# Patient Record
Sex: Male | Born: 1940 | Race: White | Hispanic: No | Marital: Married | Smoking: Never smoker
Health system: Southern US, Community
[De-identification: ages and names within clinical notes are randomized; demographics above are authoritative.]

## PROBLEM LIST (undated history)

## (undated) DIAGNOSIS — G20A1 Parkinson's disease without dyskinesia, without mention of fluctuations: Secondary | ICD-10-CM

## (undated) DIAGNOSIS — N400 Enlarged prostate without lower urinary tract symptoms: Secondary | ICD-10-CM

## (undated) DIAGNOSIS — G20 Parkinson's disease: Secondary | ICD-10-CM

## (undated) DIAGNOSIS — K219 Gastro-esophageal reflux disease without esophagitis: Secondary | ICD-10-CM

## (undated) DIAGNOSIS — E785 Hyperlipidemia, unspecified: Secondary | ICD-10-CM

## (undated) DIAGNOSIS — H409 Unspecified glaucoma: Secondary | ICD-10-CM

## (undated) DIAGNOSIS — H269 Unspecified cataract: Secondary | ICD-10-CM

## (undated) HISTORY — PX: HERNIA REPAIR: SHX51

## (undated) HISTORY — PX: NECK SURGERY: SHX720

## (undated) HISTORY — PX: EYE SURGERY: SHX253

## (undated) HISTORY — DX: Unspecified cataract: H26.9

---

## 2000-12-15 ENCOUNTER — Ambulatory Visit (HOSPITAL_COMMUNITY): Admission: RE | Admit: 2000-12-15 | Discharge: 2000-12-15 | Payer: Self-pay | Admitting: Gastroenterology

## 2000-12-15 ENCOUNTER — Encounter (INDEPENDENT_AMBULATORY_CARE_PROVIDER_SITE_OTHER): Payer: Self-pay | Admitting: Specialist

## 2006-09-21 ENCOUNTER — Ambulatory Visit (HOSPITAL_COMMUNITY): Admission: RE | Admit: 2006-09-21 | Discharge: 2006-09-21 | Payer: Self-pay | Admitting: Gastroenterology

## 2008-07-25 ENCOUNTER — Ambulatory Visit: Payer: Self-pay | Admitting: Internal Medicine

## 2008-07-25 DIAGNOSIS — R0602 Shortness of breath: Secondary | ICD-10-CM

## 2008-07-25 LAB — CONVERTED CEMR LAB
Basophils Relative: 0.5 % (ref 0.0–3.0)
Eosinophils Absolute: 0.1 10*3/uL (ref 0.0–0.7)
Lymphocytes Relative: 38.4 % (ref 12.0–46.0)
MCV: 90.2 fL (ref 78.0–100.0)
Monocytes Relative: 9.4 % (ref 3.0–12.0)
Neutro Abs: 3.2 10*3/uL (ref 1.4–7.7)
Neutrophils Relative %: 49.8 % (ref 43.0–77.0)
Platelets: 219 10*3/uL (ref 150–400)
RBC: 5.15 M/uL (ref 4.22–5.81)
RDW: 12.5 % (ref 11.5–14.6)

## 2008-08-02 DIAGNOSIS — J309 Allergic rhinitis, unspecified: Secondary | ICD-10-CM | POA: Insufficient documentation

## 2008-08-29 ENCOUNTER — Ambulatory Visit: Payer: Self-pay | Admitting: Internal Medicine

## 2008-08-29 DIAGNOSIS — J984 Other disorders of lung: Secondary | ICD-10-CM

## 2008-12-27 ENCOUNTER — Ambulatory Visit: Payer: Self-pay | Admitting: Internal Medicine

## 2008-12-27 DIAGNOSIS — Z77090 Contact with and (suspected) exposure to asbestos: Secondary | ICD-10-CM

## 2009-12-26 ENCOUNTER — Ambulatory Visit: Payer: Self-pay | Admitting: Internal Medicine

## 2010-01-23 ENCOUNTER — Telehealth (INDEPENDENT_AMBULATORY_CARE_PROVIDER_SITE_OTHER): Payer: Self-pay | Admitting: *Deleted

## 2010-11-26 NOTE — Assessment & Plan Note (Signed)
Summary: 12 months/apc   Visit Type:  12 month visit Primary Provider/Referring Provider:  [facility]  CC:  no complaints.  History of Present Illness: 08/29/08- Dyspnea. No change from experience in mountains of [place]. Occ pains , mild, mostly right parasternal, nonexertional. Dr. [place] checked cardiology. PFT reviewed- high normal scores - 98%, 97%, 96%, 429 yards.  CXR- 07/25/08- NAD, incidental 4 mm nodule.CBC- NL, BNP- 64- NL.  12/27/08- Dyspnea, Lung nodule/scar Went back to [place] during winter. Had a febrile/ chill illness. Notes occasional choke on thin liquids- relates it to hx of reflux/ fundoplication. Denies waking at night with choke at this time. PPD was negative. Discussed remote significant exposure hx -asbestos and histo.  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 Chest feels fine. He is aware of some pharyngeal irritation that is easily ignored, like liquid going where it doesn't belong. Chokes easily swallowing. No choking in sleep. Denies any reflux since remote hiatal hernia surgery. Easy DOE with effort, probably unchanged and without pain, cough or sneeze. We reviewed PFT WNL, from 08/2008. CXR had shown RUL nodule- for f/u.   Current Medications (verified): 1)  Travatan Z 0.004 % Soln (Travoprost) .Marland Kitchen.. 1 Drop Daily in Each Eye 2)  Dorzolamide Hcl-Timolol Mal 22.3-6.8 Mg/ml Soln (Dorzolamide Hcl-Timolol Mal) .Marland Kitchen.. 1 Drop in Each Eye 2 Times A Day  Allergies (verified): No Known Drug Allergies  Past History:  Past Medical History: Last updated: 08/29/2008 Allergic Rhinitis HX GERD Dyspnea          -CXR 4mm nodule 07/25/08          - NL CBC, D-dimer, BNP           - PFT NL, NL  Past Surgical History: Last updated: 07/25/2008 Hiatal hernia/ fundoplication 1993 Laminectomy  Tonsilectomy Bleeding ulcer 1992 2ry to NSAIDs  Family History: Last updated:  07/25/2008 Father- Parkinson's, pneumonia Mother- peritonitis  Social History: Last updated: 12/27/2008 Patient never smoked. 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  The patient denies anorexia, fever, weight loss, weight gain, vision loss, hoarseness, chest pain, syncope, peripheral edema, prolonged cough, headaches, hemoptysis, and severe indigestion/heartburn.    Vital Signs:  Patient profile:   70 year old male Height:      71.5 inches Weight:      211.38 pounds BMI:     29.18 O2 Sat:      100 % on Room air Pulse rate:   62 / minute BP sitting:   100 / 68  (left arm) Cuff size:   regular  Vitals Entered By: Clarise Cruz Duncan Dull) (December 26, 2009 9:13 AM)  O2 Flow:  Room air  Physical Exam  Additional Exam:  General: A/Ox3; pleasant and cooperative, NAD, gentle, quiet man,  SKIN: no rash, lesions NODES: no lymphadenopathy HEENT: [place]/AT, EOM- WNL, Conjuctivae- clear, PERRLA, TM-WNL, Nose- clear, Throat- clear and wnl NECK: Supple w/ fair ROM, JVD- none, normal carotid impulses w/o bruits Thyroid- CHEST: Clear to P&A- very clear HEART: RRR, no m/g/r heard, question if second sound is loud at aortic space ABDOMEN:  ZOX:WRUE, nl pulses, no edema  NEURO:  Grossly intact to observation      Impression & Recommendations:  Problem # 1:  LUNG NODULE (ICD-518.89)  For repeat CXR of lung nodule.  Problem # 2:  DYSPNEA (ICD-786.05)  I think this is just an endurance/ stamina issue for a nonathletic individual.  Medications Added to Medication List This Visit: 1)  Travatan Z 0.004 % Soln (Travoprost) .Marland Kitchen.. 1 drop daily in each eye 2)  Dorzolamide Hcl-timolol Mal 22.3-6.8 Mg/ml Soln (Dorzolamide hcl-timolol mal) .Marland Kitchen.. 1 drop in each eye 2 times a  day  Other Orders: Est. Patient Level III (14782) T-2 View CXR (71020TC)  Patient Instructions: 1)  Schedule return in one year, earlier if needed 2)  A chest x-ray has been recommended.  Your imaging study may require preauthorization.

## 2010-11-26 NOTE — Progress Notes (Signed)
Summary: Records request from [facility]  Request for records received from [facility]. Request forwarded to [employer]. Dena Chavis  January 23, 2010 10:06 AM  Appended Document: Records request from [facility] Request for records from [facility]. Request forwarded to [employer].

## 2010-12-25 ENCOUNTER — Ambulatory Visit: Payer: Self-pay | Admitting: Internal Medicine

## 2010-12-30 ENCOUNTER — Ambulatory Visit (INDEPENDENT_AMBULATORY_CARE_PROVIDER_SITE_OTHER): Payer: Medicare Other | Admitting: Internal Medicine

## 2010-12-30 ENCOUNTER — Encounter: Payer: Self-pay | Admitting: Internal Medicine

## 2010-12-30 DIAGNOSIS — R0602 Shortness of breath: Secondary | ICD-10-CM

## 2010-12-30 DIAGNOSIS — Z77090 Contact with and (suspected) exposure to asbestos: Secondary | ICD-10-CM

## 2010-12-30 DIAGNOSIS — J984 Other disorders of lung: Secondary | ICD-10-CM

## 2011-01-05 NOTE — Assessment & Plan Note (Signed)
Summary: follow up visit/kcw   Primary Provider/Referring Provider:  [facility]  CC:  Follow up visit-"flap" in throat-feels like a tickle sensation in it.Phillip Acosta  History of Present Illness:  12/27/08- Dyspnea, Lung nodule/scar Went back to [place] during winter. Had a febrile/ chill illness. Notes occasional choke on thin liquids- relates it to hx of reflux/ fundoplication. Denies waking at night with choke at this time. PPD was negative. Discussed remote significant exposure hx -asbestos and histo.  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 Chest feels fine. He is aware of some pharyngeal irritation that is easily ignored, like liquid going where it doesn't belong. Chokes easily swallowing. No choking in sleep. Denies any reflux since remote hiatal hernia surgery. Easy DOE with effort, probably unchanged and without pain, cough or sneeze. We reviewed PFT WNL, from 08/2008. CXR had shown RUL nodule- for f/u.  December 30, 2010-  Dyspnea, Lung nodule/ scar CXR March, 2011 was NAD/ bronchitis- reviewed w/ him.  He was in [place] again 3 times since last here, and once to [place], most recently in [place] Feb 2-6, 2012. Admits some cough, productive white which is incidental. Wakes in the morning with some head and chest congestion after he starts moving. He doesn't feel need to take anything for it. We discussed Mucinex and hydration. Throat tickle. May choke a little swallowing at times.       Preventive Screening-Counseling & Management  Alcohol-Tobacco     Smoking Status: never  Current Medications (verified): 1)  Travatan Z 0.004 % Soln (Travoprost) .Phillip Acosta.. 1 Drop Daily in Each Eye 2)  Dorzolamide Hcl-Timolol Mal 22.3-6.8 Mg/ml Soln (Dorzolamide Hcl-Timolol Mal) .Phillip Acosta.. 1 Drop in Each Eye 2 Times A Day 3)  Niacin 500 Mg Tabs (Niacin) .... Take 1 By Mouth Once Daily  Allergies (verified): No Known Drug  Allergies  Past History:  Past Medical History: Last updated: 08/29/2008 Allergic Rhinitis HX GERD Dyspnea          -CXR 4mm nodule 07/25/08          - NL CBC, D-dimer, BNP           - PFT NL, NL  Past Surgical History: Last updated: 07/25/2008 Hiatal hernia/ fundoplication 1993 Laminectomy  Tonsilectomy Bleeding ulcer 1992 2ry to NSAIDs  Family History: Last updated: 07/25/2008 Father- Parkinson's, pneumonia Mother- peritonitis  Social History: Last updated: 12/27/2008 Patient never smoked. 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  The patient denies shortness of breath at rest, non-productive cough, coughing up blood, chest pain, irregular heartbeats, acid heartburn, indigestion, loss of appetite, weight change, abdominal pain, difficulty swallowing, sore throat, tooth/dental problems, headaches, ear ache, hand/feet swelling, rash, change in color of mucus, and fever.    Vital Signs:  Patient profile:   70 year old male Height:      71.5 inches Weight:      215.25 pounds BMI:     29.71 O2 Sat:      100 % on Room air Pulse rate:   52 / minute BP sitting:  102 / 54  (left arm) Cuff size:   regular  Vitals Entered By: Reynaldo Minium CMA (December 30, 2010 11:45 AM)  O2 Flow:  Room air CC: Follow up visit-"flap" in throat-feels like a tickle sensation in it.   Physical Exam  Additional Exam:  General: A/Ox3; pleasant and cooperative, NAD, gentle, quiet man,  SKIN: no rash, lesions NODES: no lymphadenopathy HEENT: [employer]/AT, EOM- WNL, Conjuctivae- clear, PERRLA, TM-WNL, Nose- clear, Throat- clear and wnl NECK: Supple w/ fair ROM, JVD- none, normal  carotid impulses w/o bruits Thyroid- CHEST: Clear to P&A- very clear HEART: RRR, no m/g/r heard, question if second sound is loud at aortic space ABDOMEN:  NFA:OZHY, nl pulses, no edema  NEURO: Grossly intact to observation      EKG  Procedure date:  12/26/2009  Findings:       CHEST - 2 VIEW Comparison: 07/25/2008 Findings: Normal cardiomediastinal silhouette.  Lungs are clear. No nodules are appreciated.  No pleural thickening/effusions.  Minimal scarring at the left lung base.  Minimal central peribronchial thickening, a chronic finding.   Intact bony thorax.  Spondylosis of the lower dorsal spine. IMPRESSION: No active chest disease.  See comments above. Read By:  Jonne Ply,  M.D.  Impression & Recommendations:  Problem # 1:  LUNG NODULE (ICD-518.89)  Lung nodule was not seen in 2011, 2 years aftrer initial film, so it has not grown. We discussed this and he feels comfortable returning on an as needed basis.   Problem # 2:  ASBESTOS EXPOSURE, HX OF (ICD-V15.84)  Significant changes, plaques etc are not seen.   Problem # 3:  DYSPNEA (ICD-786.05)  Minor bronchitic and scarring changes, quiet chest and little in way of symptoms. He understands to report changes.   Medications Added to Medication List This Visit: 1)  Niacin 500 Mg Tabs (Niacin) .... Take 1 by mouth once daily  Other Orders: Est. Patient Level III (86578)  Patient Instructions: 1)  Please schedule a follow-up appointment as needed. I will be happy to see you again as needed, especially for changes in the way your chest feels, cough or etc.    EKG  Procedure date:  12/26/2009  Findings:       CHEST - 2 VIEW Comparison: 07/25/2008 Findings: Normal cardiomediastinal silhouette.  Lungs are clear. No nodules are appreciated.  No pleural thickening/effusions.  Minimal scarring at the left lung base.  Minimal central peribronchial thickening, a chronic finding.   Intact bony  thorax.  Spondylosis of the lower dorsal spine. IMPRESSION: No active chest disease.  See comments above. Read By:  Jonne Ply,  Judie Petit.D.

## 2011-03-12 NOTE — Procedures (Signed)
[facility]  Patient:    Phillip Acosta, Phillip Acosta                         MRN: [id number] Proc. Date: 12/15/00 Adm. Date:  [id number] Attending:  Nelda Marseille CC:         Laban Emperor. Cloward, M.D., [facility]   Procedure Report  PROCEDURE:  Colonscopy with polypectomy.  ENDOSCOPIST:  Petra Kuba, M.D.  INDICATIONS:  Guaiac positivity, due for colonic screening.  Consent was signed after risks, benefits, methods, and options were thoroughly discussed in the office.  MEDICINES USED:  Demerol 50, Versed 5.  DESCRIPTION OF PROCEDURE:  Rectal inspection was pertinent for external hemorrhoids, small.  Digital exam was negative.  Video colonoscope was inserted and easily advanced around the colon to the cecum.  This did require some abdominal pressure but no position changes.  The cecum was identified by the appendiceal orifice and the ileocecal valve.  No obvious abnormalities, but left-sided diverticulum were seen on insertion.  The scope was slowly withdrawn.  Prep was adequate.  There was some liquid stool that required washing and suctioning.  Cecum, ascending, and transverse were normal.  As the scope was withdrawn around the descending, a small 2 to 3 mm polyp was seen, was hot biopsied x 1.  There were some left-sided diverticulum and wall edema, but no other abnormalities were seen as we withdrew back to the distal sigmoid where another small, 1 to 2 mm polyp was seen and was hot biopsied x 1.  The scope was withdrawn back to the rectum, and a small polyp was seen on the fold which was initially snared, but the snare cut through the polyp, and we hot biopsied the base.  The polyp that was removed was suctioned through the scope, collected in the trap, and recovered.  All polyps were put in the same container.  The scope was retroflexed back in the rectum pertinent for some internal hemorrhoids.  The scope was then straightened and readvanced a  short ways up the sigmoid.  Air was suctioned and scope removed.  The patient tolerated the procedure well.  There was no obvious immediate complication.  ENDOSCOPIC DIAGNOSIS: 1. Internal and external hemorrhoids. 2. Left-sided diverticulum and wall edema. 3. Three small polyps, two hot biopsied in the distal sigmoid and descending,    one in the rectum snared and hot biopsied. 4. Otherwise within normal limits to the cecum.  PLAN:  Await pathology to determine future colonic screening.  Otherwise see back p.r.n. or in two mounts to recheck guaiacs and make sure no further workup plans are needed.  In the meantime, one-week post polypectomy instructions. DD:  12/15/00 TD:  12/16/00 Job: [id number]

## 2011-03-12 NOTE — Op Note (Signed)
NAME:  Phillip Acosta, ARBOLEDA NO.:  [id number]   MEDICAL RECORD NO.:  [id number]          PATIENT TYPE:  AMB   LOCATION:  ENDO                         FACILITY:  [facility]   PHYSICIAN:  Anselmo Rod, M.D.  DATE OF BIRTH:  17-Feb-1941   DATE OF PROCEDURE:  09/21/2006  DATE OF DISCHARGE:                               OPERATIVE REPORT   PROCEDURE PERFORMED:  Screening colonoscopy.   ENDOSCOPIST:  Anselmo Rod, M.D.   INSTRUMENT USED:  Olympus video colonoscope.   INDICATIONS FOR PROCEDURE:  A 70 year old white male with a personal  history of colonic polyps undergoing surveillance colonoscopy to rule  out recurrent polyps, masses, etc.  The patient has occasional rectal  bleeding which he attributes to his hemorrhoids.   PREPROCEDURE PREPARATION:  Informed consent was procured from the  patient.  The patient was fasted for eight hours prior to the procedure  and prepped with a gallon of TriLyte the night prior to the procedure.  The risks and benefits of the procedure including a 10% miss rate for  cancer or polyps was discussed with the patient as well.   PREPROCEDURE PHYSICAL:  The patient had stable vital signs.  Neck  supple.  Chest clear to auscultation.  S1 and S2 regular.  Abdomen soft  with normal bowel sounds.   DESCRIPTION OF PROCEDURE:  The patient was placed in left lateral  decubitus position and sedated with 75 mcg of fentanyl and 7.5 mg of  Versed given intravenously in slow incremental doses.  Once the patient  was adequately sedated and maintained on low flow oxygen and continuous  cardiac monitoring, the Olympus video colonoscope was advanced from the  rectum to the cecum.  The appendicular orifice and ileocecal valve were  clearly visualized and photographed.  There was a malfunctioning with  the process and therefore the photographs in the right colon did not  take.  The terminal ileum appeared healthy and without lesions.  There  was a  significant amount of residual stool in the dependent areas of the  colon.  Multiple washes were done.  Small lesions could be missed.  Small internal hemorrhoids were seen on retroflexion.  A few early  sigmoid diverticula were appreciated.  The patient tolerated the  procedure well without immediate complication.   IMPRESSION:  1. Small nonbleeding internal hemorrhoids.  2. Early sigmoid diverticulosis.  3. No masses or polyps seen.  4. Significant amount of residual stool in the colon, multiple washes      were done.  Small lesions could be missed.   RECOMMENDATIONS:  1. Continue high fiber diet with liberal fluid intake.  2. Repeat colonoscopy in the next five years unless the patient      develops any abnormal symptoms in the interim, in which case he      should contact the office immediately for further recommendations.  3. Outpatient followup as need arises in the future.      Anselmo Rod, M.D.  Electronically Signed     JNM/MEDQ  D:  09/21/2006  T:  09/21/2006  Job:  [id number]   cc:   Gabriel Earing, M.D.

## 2012-07-07 DIAGNOSIS — Z818 Family history of other mental and behavioral disorders: Secondary | ICD-10-CM | POA: Insufficient documentation

## 2012-07-21 ENCOUNTER — Other Ambulatory Visit: Payer: Self-pay | Admitting: Gastroenterology

## 2012-07-21 DIAGNOSIS — R1310 Dysphagia, unspecified: Secondary | ICD-10-CM

## 2012-07-27 ENCOUNTER — Ambulatory Visit
Admission: RE | Admit: 2012-07-27 | Discharge: 2012-07-27 | Disposition: A | Discharge status: Home / Self Care | Payer: Medicare Other | Source: Ambulatory Visit | Attending: Gastroenterology | Admitting: Gastroenterology

## 2012-07-27 DIAGNOSIS — R1310 Dysphagia, unspecified: Secondary | ICD-10-CM

## 2012-12-22 ENCOUNTER — Emergency Department (HOSPITAL_COMMUNITY)
Admission: EM | Admit: 2012-12-22 | Discharge: 2012-12-22 | Disposition: A | Discharge status: Home / Self Care | Payer: Medicare Other | Attending: Emergency Medicine | Admitting: Emergency Medicine

## 2012-12-22 ENCOUNTER — Emergency Department (HOSPITAL_COMMUNITY): Payer: Medicare Other

## 2012-12-22 ENCOUNTER — Encounter (HOSPITAL_COMMUNITY): Payer: Self-pay | Admitting: Emergency Medicine

## 2012-12-22 DIAGNOSIS — S0990XA Unspecified injury of head, initial encounter: Secondary | ICD-10-CM | POA: Insufficient documentation

## 2012-12-22 DIAGNOSIS — Y92410 Unspecified street and highway as the place of occurrence of the external cause: Secondary | ICD-10-CM | POA: Insufficient documentation

## 2012-12-22 DIAGNOSIS — S0993XA Unspecified injury of face, initial encounter: Secondary | ICD-10-CM | POA: Insufficient documentation

## 2012-12-22 DIAGNOSIS — Z7982 Long term (current) use of aspirin: Secondary | ICD-10-CM | POA: Insufficient documentation

## 2012-12-22 DIAGNOSIS — Y9389 Activity, other specified: Secondary | ICD-10-CM | POA: Insufficient documentation

## 2012-12-22 DIAGNOSIS — Z79899 Other long term (current) drug therapy: Secondary | ICD-10-CM | POA: Insufficient documentation

## 2012-12-22 MED ORDER — HYDROCODONE-ACETAMINOPHEN 5-325 MG PO TABS: 1.0000 | ORAL_TABLET | Freq: Three times a day (TID) | ORAL | Status: DC | PRN
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
## 2012-12-22 NOTE — ED Notes (Signed)
Dr.Lockwood at bedside  
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
## 2012-12-22 NOTE — ED Notes (Signed)
Pt returned from xray

## 2012-12-22 NOTE — ED Notes (Signed)
Discharge instructions reviewed. Pt verbalized understanding.  
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
## 2014-02-12 ENCOUNTER — Other Ambulatory Visit (HOSPITAL_COMMUNITY): Payer: Self-pay | Admitting: Otolaryngology

## 2014-02-12 DIAGNOSIS — R1310 Dysphagia, unspecified: Secondary | ICD-10-CM

## 2014-02-14 ENCOUNTER — Ambulatory Visit (HOSPITAL_COMMUNITY): Payer: No Typology Code available for payment source

## 2014-02-20 ENCOUNTER — Ambulatory Visit (HOSPITAL_COMMUNITY)
Admission: RE | Admit: 2014-02-20 | Discharge: 2014-02-20 | Disposition: A | Discharge status: Home / Self Care | Payer: Medicare HMO | Source: Ambulatory Visit | Attending: Otolaryngology | Admitting: Otolaryngology

## 2014-02-20 DIAGNOSIS — R1311 Dysphagia, oral phase: Secondary | ICD-10-CM | POA: Insufficient documentation

## 2014-02-20 DIAGNOSIS — R1313 Dysphagia, pharyngeal phase: Secondary | ICD-10-CM | POA: Insufficient documentation

## 2014-02-20 DIAGNOSIS — R1310 Dysphagia, unspecified: Secondary | ICD-10-CM | POA: Insufficient documentation

## 2014-02-20 NOTE — Procedures (Signed)
Objective Swallowing Evaluation: Modified Barium Swallowing Study  Patient Details  Name: Phillip Acosta MRN: [id number] Date of Birth: 08/18/1941  Today's Date: 02/20/2014 Time: [id number] SLP Time Calculation (min): 30 min  Past Medical History: No past medical history on file. Past Surgical History:  Past Surgical History  Procedure Laterality Date  . Neck surgery     HPI:  73 year old seen for outpatient MBS with complaints of pharyngeal and esophageal globus sensation, coughing with liquids intermittently and vocal cords "seem coated."  PMH:  hiatial hernia with Nissen fundiplication, neck surgery many years ago without hardware.  Pt. denies recent respiratory illness or pna.  He denies takes omeprazole daily.    Assessment / Plan / Recommendation Clinical Impression  Dysphagia Diagnosis: Mild oral phase dysphagia;Mild pharyngeal phase dysphagia Clinical impression: Pt. exhibited minimal oral dysphagia resulting in lingual residue and piecemeal transit pattern.  Min-mild pharyngeal dysphagia with reduced tongue base retraction laryngeal elevation leading to consistent mild-moderate vallecular and pyriform sinus residue.  Pt. inconsistently sensed residue and swallows twice to decrease/clear residue.  Flash laryngeal penetration observed once when barium briefly entered the laryngeal vestibule and spontaneously exited during the swallow.  A chin tuck posture did not produce any significant benefit or improvement in swallow function.  Pt. demonstrated a hoarse, sometimes wet vocal quality immediately following liquid and puree without observance of penetration.  SLP recommends continue regular texture diet (using caution with dry/crumbly foods and meats) and thin liquids, straws okay with small sips and pills whole in applesauce/yogurt/pudding if desired.            Treatment Recommendation  No treatment recommended at this time    Diet Recommendation Regular;Thin liquid   Liquid  Administration via: Cup;Straw Medication Administration: Whole meds with liquid (attempt whole applesauce if difficulty w/ liq) Supervision: Patient able to self feed Compensations: Slow rate;Small sips/bites;Multiple dry swallows after each bite/sip;Clear throat intermittently Postural Changes and/or Swallow Maneuvers: Seated upright 90 degrees;Upright 30-60 min after meal    Other  Recommendations Oral Care Recommendations: Oral care BID   Follow Up Recommendations  None    Frequency and Duration        Pertinent Vitals/Pain WDL          Reason for Referral Objectively evaluate swallowing function   Oral Phase Oral Preparation/Oral Phase Oral Phase: Impaired Oral - Thin Oral - Thin Cup: Piecemeal swallowing;Lingual/palatal residue Oral - Solids Oral - Puree: Lingual/palatal residue;Piecemeal swallowing Oral - Regular: Lingual/palatal residue   Pharyngeal Phase Pharyngeal Phase Pharyngeal Phase: Impaired Pharyngeal - Thin Pharyngeal - Thin Cup: Significant aspiration (Amount);Pharyngeal residue - valleculae;Reduced tongue base retraction;Reduced laryngeal elevation;Penetration/Aspiration during swallow Penetration/Aspiration details (thin cup): Material enters airway, remains ABOVE vocal cords then ejected out (x 1) Pharyngeal - Thin Straw: Significant aspiration (Amount);Pharyngeal residue - valleculae;Reduced tongue base retraction;Reduced laryngeal elevation Pharyngeal - Solids Pharyngeal - Regular: Pharyngeal residue - valleculae;Reduced tongue base retraction  Cervical Esophageal Phase    GO    Cervical Esophageal Phase [facility]erv[facility] Esophageal [facility]: [facility]    Functional Assessment Tool Used: skilled clinical judgement Functional Limitations: Swallowing Swallow Current Status (L2440(G8996): At least 20 percent but less than 40 percent impaired, limited or restricted Swallow Goal Status [phone](G8997): At least 20 percent but less than 40 percent impaired, limited or  restricted Swallow Discharge Status [phone](G8998): At least 20 percent but less than 40 percent impaired, limited or restricted    Royce MacadamiaLisa Willis Kamrin Sibley M.Ed [employer]-SLP Pager [phone]  02/20/2014

## 2014-12-04 DIAGNOSIS — N3941 Urge incontinence: Secondary | ICD-10-CM | POA: Insufficient documentation

## 2014-12-04 DIAGNOSIS — N401 Enlarged prostate with lower urinary tract symptoms: Secondary | ICD-10-CM

## 2014-12-04 DIAGNOSIS — E785 Hyperlipidemia, unspecified: Secondary | ICD-10-CM | POA: Insufficient documentation

## 2014-12-04 DIAGNOSIS — K219 Gastro-esophageal reflux disease without esophagitis: Secondary | ICD-10-CM | POA: Insufficient documentation

## 2015-06-17 ENCOUNTER — Other Ambulatory Visit (HOSPITAL_COMMUNITY): Payer: Self-pay | Admitting: Gastroenterology

## 2015-06-17 DIAGNOSIS — R1314 Dysphagia, pharyngoesophageal phase: Secondary | ICD-10-CM

## 2015-06-24 ENCOUNTER — Ambulatory Visit (HOSPITAL_COMMUNITY)
Admission: RE | Admit: 2015-06-24 | Discharge: 2015-06-24 | Disposition: A | Discharge status: Home / Self Care | Payer: Medicare HMO | Source: Ambulatory Visit | Attending: Gastroenterology | Admitting: Gastroenterology

## 2015-06-24 ENCOUNTER — Ambulatory Visit (HOSPITAL_COMMUNITY): Payer: Medicare HMO

## 2015-06-24 DIAGNOSIS — R1310 Dysphagia, unspecified: Secondary | ICD-10-CM | POA: Diagnosis not present

## 2015-06-24 DIAGNOSIS — R1314 Dysphagia, pharyngoesophageal phase: Secondary | ICD-10-CM

## 2015-06-25 DIAGNOSIS — R1310 Dysphagia, unspecified: Secondary | ICD-10-CM | POA: Diagnosis not present

## 2015-06-25 NOTE — Progress Notes (Signed)
Speech Pathology   MBSS complete. Full report located under chart review in imaging section. Click on DG swallow function.     Neshia Mckenzie Willis Narely Nobles M.Ed CCC-SLP Pager [phone]   

## 2016-01-21 IMAGING — RF DG SWALLOWING FUNCTION
1 series · 17 of 24 positions shown · non-contrast
Comparison: None.

CLINICAL DATA: Difficulty with swallowing.

EXAM:
MODIFIED BARIUM SWALLOW
TECHNIQUE: Different consistencies of barium were administered orally to the
patient by the Speech Pathologist. Imaging of the pharynx was
performed in the lateral projection.
FLUOROSCOPY TIME:  Radiation Exposure Index (as provided by the
fluoroscopic device): 275 microGy*m^2
If the device does not provide the exposure index:
Fluoroscopy Time:  dictate in minutes and seconds
Number of Acquired Images:

[Series 1: run · 24 acquisitions, 17 frames shown]
[im 1/24]
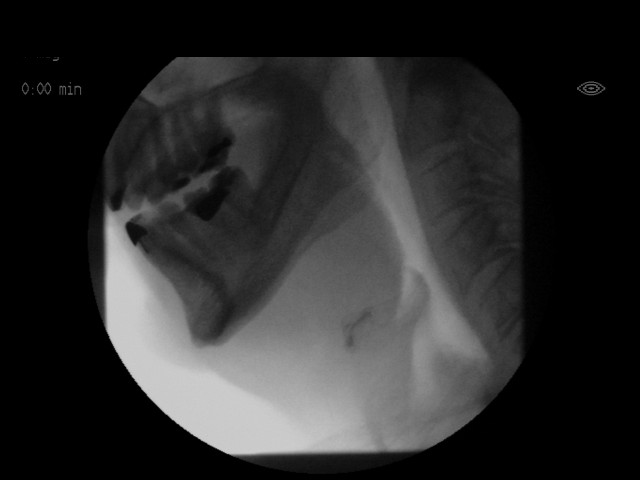
[im 3/24]
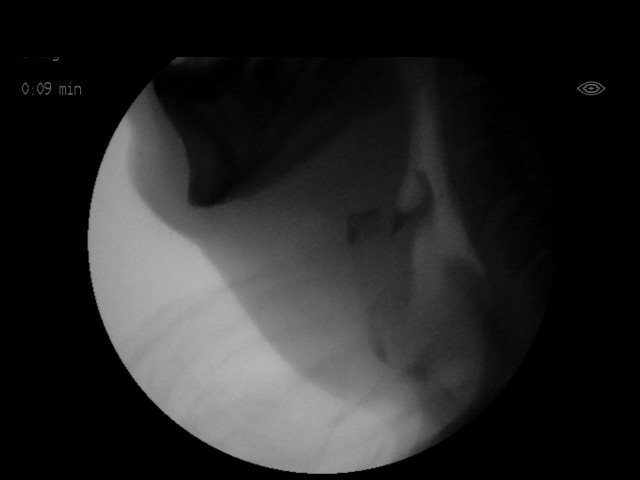
[im 4/24]
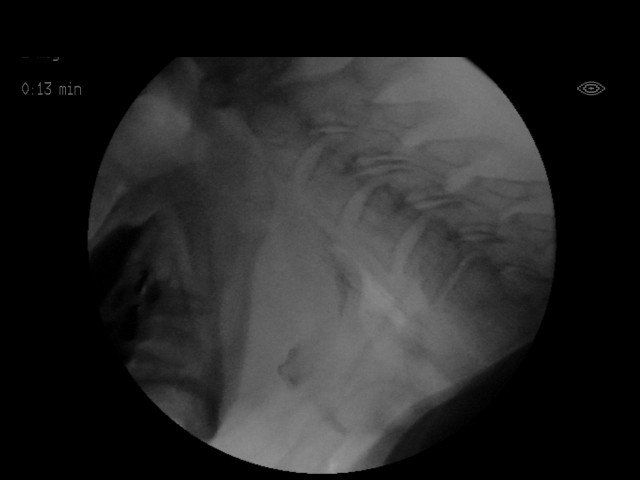
[im 5/24]
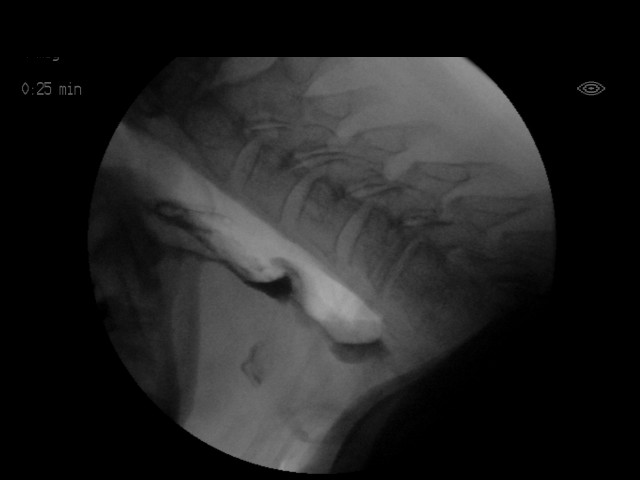
[im 7/24]
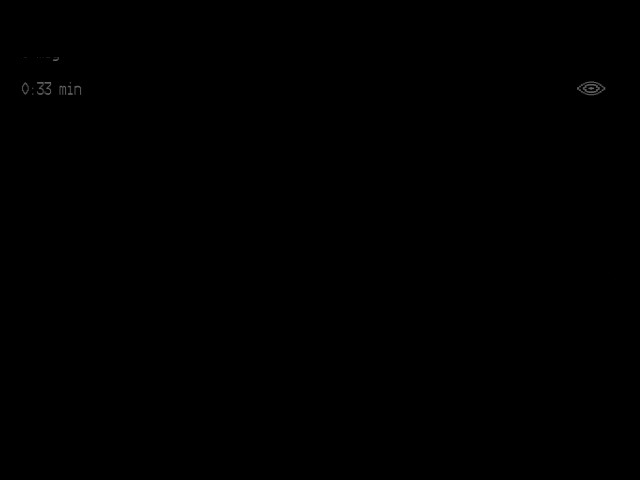
[im 8/24]
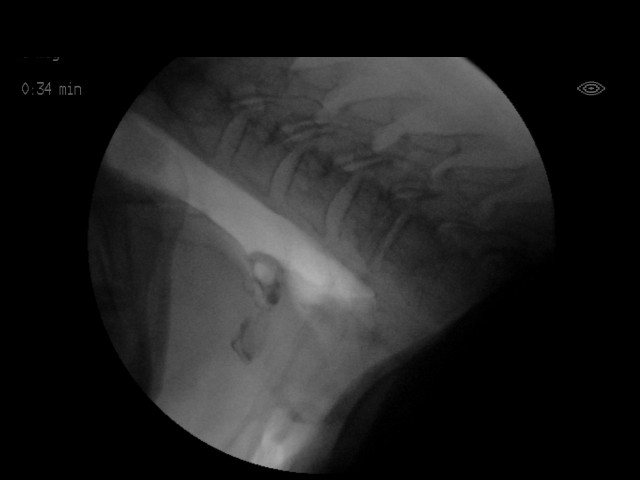
[im 10/24]
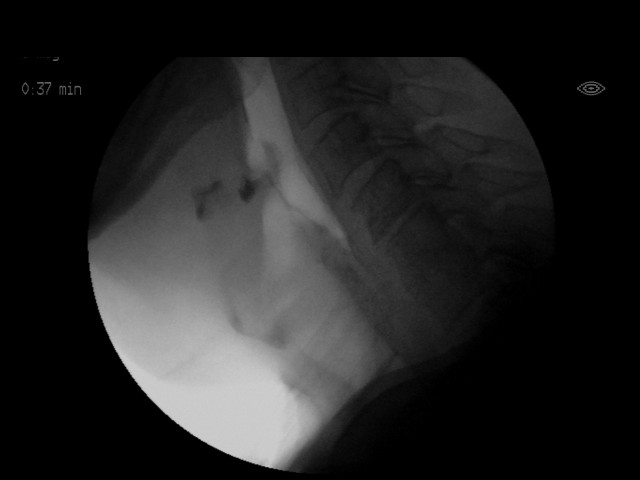
[im 11/24]
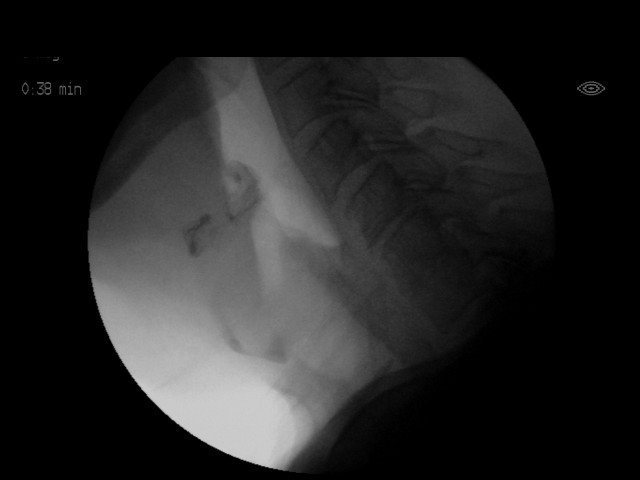
[im 13/24]
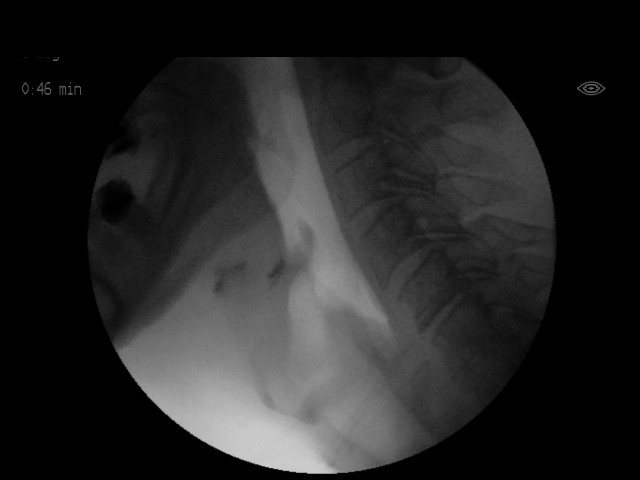
[im 14/24]
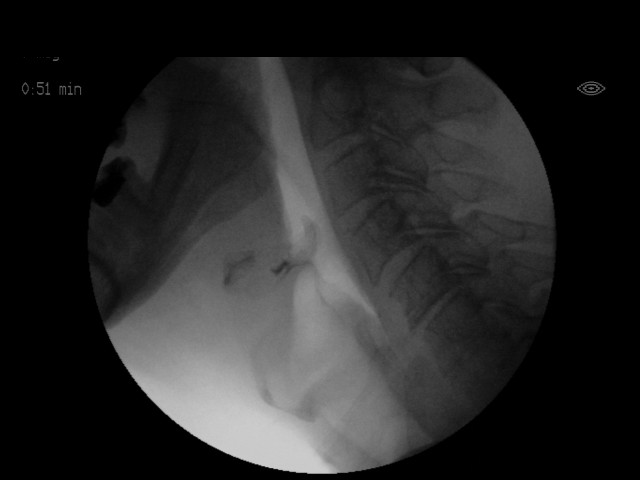
[im 15/24]
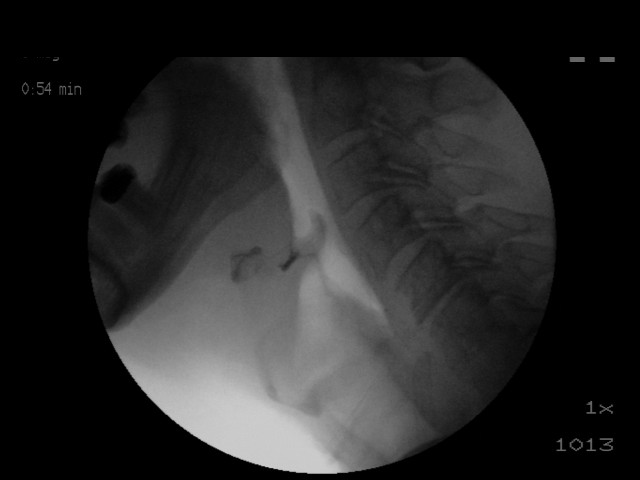
[im 17/24]
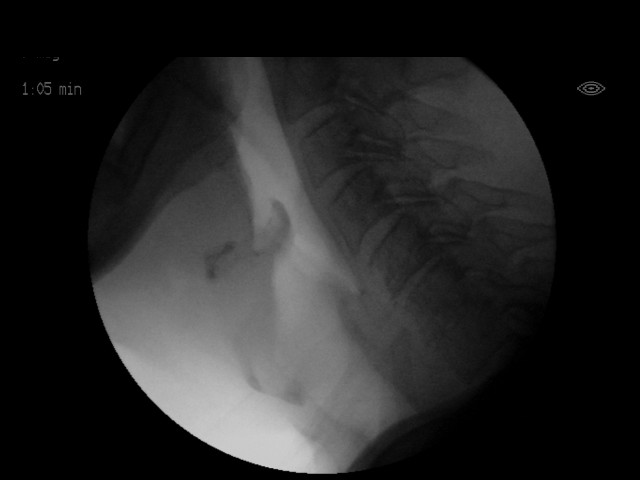
[im 18/24]
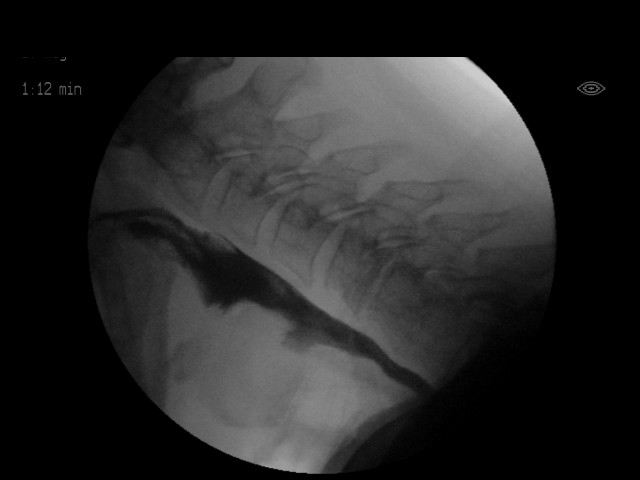
[im 20/24]
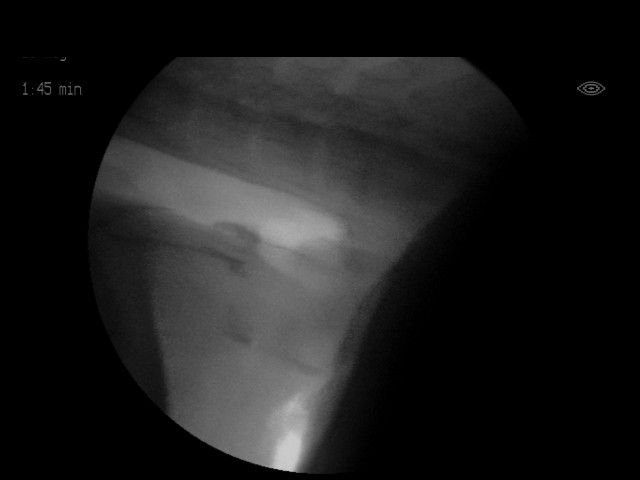
[im 21/24]
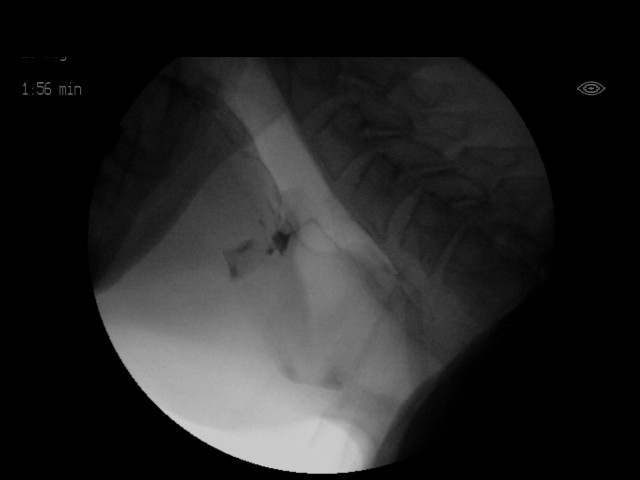
[im 22/24]
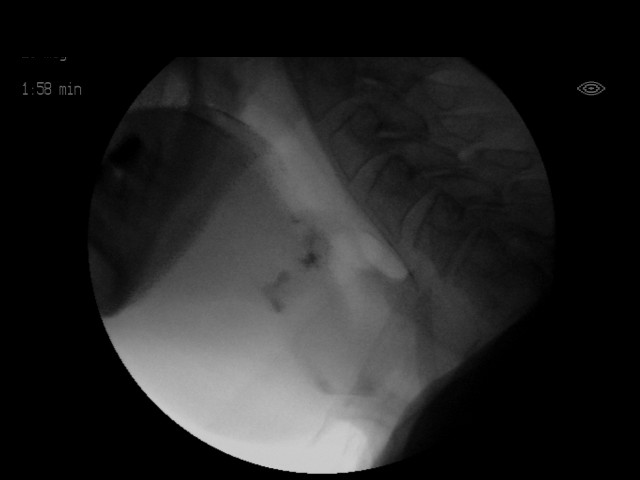
[im 24/24]
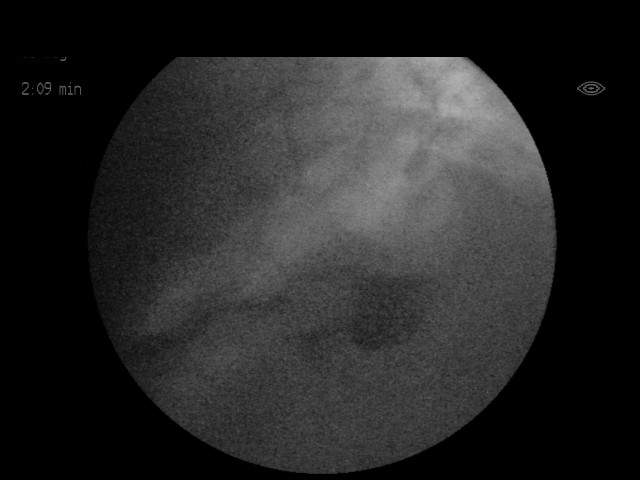

[17 of 24 positions shown; findings below may reference images not displayed]

FINDINGS: Thin liquid- swallowing of thin liquid from a cup demonstrated
penetration to the cords, without a cough.

Five subsequent swallows with chin-tuck demonstrated no laryngeal
penetration, although several head early spill over.

Hamill?Mangione with cracker- normal, with chin-tuck.

Barium tablet -  within normal limits, with chin-tuck
IMPRESSION: 1. Thin liquid penetration to the laryngeal cords, without a cough.
This resolved completely during six subsequent swallows using a
chin-tuck maneuver.

Please refer to the Speech Pathologists report for complete details
and recommendations.

## 2016-11-22 ENCOUNTER — Encounter (HOSPITAL_COMMUNITY): Payer: Self-pay | Admitting: Emergency Medicine

## 2016-11-22 ENCOUNTER — Emergency Department (HOSPITAL_COMMUNITY)
Admission: EM | Admit: 2016-11-22 | Discharge: 2016-11-22 | Disposition: A | Discharge status: Home / Self Care | Payer: Medicare HMO | Attending: Emergency Medicine | Admitting: Emergency Medicine

## 2016-11-22 ENCOUNTER — Emergency Department (HOSPITAL_COMMUNITY): Payer: Medicare HMO

## 2016-11-22 DIAGNOSIS — R509 Fever, unspecified: Secondary | ICD-10-CM | POA: Diagnosis not present

## 2016-11-22 DIAGNOSIS — R6889 Other general symptoms and signs: Secondary | ICD-10-CM

## 2016-11-22 DIAGNOSIS — G20 Parkinson's disease: Secondary | ICD-10-CM | POA: Insufficient documentation

## 2016-11-22 DIAGNOSIS — R5383 Other fatigue: Secondary | ICD-10-CM | POA: Diagnosis not present

## 2016-11-22 DIAGNOSIS — Z7982 Long term (current) use of aspirin: Secondary | ICD-10-CM | POA: Diagnosis not present

## 2016-11-22 HISTORY — DX: Hyperlipidemia, unspecified: E78.5

## 2016-11-22 HISTORY — DX: Unspecified glaucoma: H40.9

## 2016-11-22 HISTORY — DX: Benign prostatic hyperplasia without lower urinary tract symptoms: N40.0

## 2016-11-22 HISTORY — DX: Parkinson's disease: G20

## 2016-11-22 HISTORY — DX: Parkinson's disease without dyskinesia, without mention of fluctuations: G20.A1

## 2016-11-22 HISTORY — DX: Gastro-esophageal reflux disease without esophagitis: K21.9

## 2016-11-22 LAB — COMPREHENSIVE METABOLIC PANEL
ALBUMIN: 4 g/dL (ref 3.5–5.0)
ALK PHOS: 61 U/L (ref 38–126)
ALT: 10 U/L — AB (ref 17–63)
ANION GAP: 8 (ref 5–15)
AST: 35 U/L (ref 15–41)
BILIRUBIN TOTAL: 1.4 mg/dL — AB (ref 0.3–1.2)
BUN: 13 mg/dL (ref 6–20)
CALCIUM: 8.4 mg/dL — AB (ref 8.9–10.3)
CO2: 24 mmol/L (ref 22–32)
CREATININE: 1.16 mg/dL (ref 0.61–1.24)
Chloride: 99 mmol/L — ABNORMAL LOW (ref 101–111)
GFR calc Af Amer: >60 mL/min (ref >60)
GFR calc non Af Amer: >60 mL/min (ref >60)
GLUCOSE: 105 mg/dL — AB (ref 65–99)
Potassium: 4.8 mmol/L (ref 3.5–5.1)
Sodium: 131 mmol/L — ABNORMAL LOW (ref 135–145)
TOTAL PROTEIN: 6.8 g/dL (ref 6.5–8.1)

## 2016-11-22 LAB — CBC WITH DIFFERENTIAL/PLATELET
BASOS ABS: 0 10*3/uL (ref 0.0–0.1)
BASOS PCT: 0 %
EOS ABS: 0 10*3/uL (ref 0.0–0.7)
Eosinophils Relative: 0 %
HEMATOCRIT: 42.9 % (ref 39.0–52.0)
Hemoglobin: 14.9 g/dL (ref 13.0–17.0)
Lymphocytes Relative: 21 %
Lymphs Abs: 1.7 10*3/uL (ref 0.7–4.0)
MCH: 31.3 pg (ref 26.0–34.0)
MCHC: 34.7 g/dL (ref 30.0–36.0)
MCV: 90.1 fL (ref 78.0–100.0)
MONO ABS: 1.3 10*3/uL — AB (ref 0.1–1.0)
MONOS PCT: 15 %
NEUTROS ABS: 5.3 10*3/uL (ref 1.7–7.7)
Neutrophils Relative %: 64 %
Platelets: 226 10*3/uL (ref 150–400)
RBC: 4.76 MIL/uL (ref 4.22–5.81)
RDW: 12.9 % (ref 11.5–15.5)
WBC: 8.4 10*3/uL (ref 4.0–10.5)

## 2016-11-22 LAB — I-STAT CG4 LACTIC ACID, ED: Lactic Acid, Venous: 0.93 mmol/L (ref 0.5–1.9)

## 2016-11-22 NOTE — ED Triage Notes (Addendum)
Per pt PCP "weakness noted, questionable pneumonia, pt with hx parkinson disease with URI symptoms 2 days ago, much worse today, unable to dress self."

## 2016-11-22 NOTE — ED Provider Notes (Signed)
[facility]-EMERGENCY DEPT Provider Note   CSN: [id number] Arrival date & time: 11/22/16  1754     History   Chief Complaint Chief Complaint  Patient presents with  . Fatigue  . URI    HPI Phillip Acosta is a 76 y.o. male.  The history is provided by the patient and the spouse.  URI   This is a new problem. Episode onset: 5 days. The problem has been gradually worsening. Maximum temperature: subjective fever. Associated symptoms include congestion, rhinorrhea, cough and wheezing. He has tried NSAIDs for the symptoms. The treatment provided moderate relief.    Past Medical History:  Diagnosis Date  . BPH (benign prostatic hyperplasia)   . GERD (gastroesophageal reflux disease)   . Glaucoma   . Hyperlipemia   . Parkinson disease [facility](HCC)     Patient Active Problem List   Diagnosis Date Noted  . ASBESTOS EXPOSURE, HX OF 12/27/2008  . LUNG NODULE 08/29/2008  . ALLERGIC RHINITIS 08/02/2008  . DYSPNEA 07/25/2008    Past Surgical History:  Procedure Laterality Date  . NECK SURGERY         Home Medications    Prior to Admission medications   Medication Sig Start Date End Date Taking? Authorizing Provider  aspirin 81 MG tablet Take 81 mg by mouth daily.    Historical Provider, MD  dorzolamide (TRUSOPT) 2 % ophthalmic solution Place 1 drop into both eyes 3 (three) times daily.    Historical Provider, MD  HYDROcodone-acetaminophen (NORCO/VICODIN) 5-325 MG per tablet Take 1 tablet by mouth every 8 (eight) hours as needed for pain. 12/22/12   Gerhard Munchobert Lockwood, MD  latanoprost (XALATAN) 0.005 % ophthalmic solution Place 1 drop into both eyes at bedtime.    Historical Provider, MD  Multiple Vitamin (MULTIVITAMIN WITH MINERALS) TABS Take 1 tablet by mouth daily.    Historical Provider, MD  niacin (NIASPAN) 500 MG CR tablet Take 500 mg by mouth at bedtime.    Historical Provider, MD  omeprazole (PRILOSEC OTC) 20 MG tablet Take 20 mg by mouth daily.    Historical Provider, MD  timolol  (BETIMOL) 0.25 % ophthalmic solution Place 1-2 drops into both eyes 2 (two) times daily.    Historical Provider, MD    Family History No family history on file.  Social History Social History  Substance Use Topics  . Smoking status: Never Smoker  . Smokeless tobacco: Not on file  . Alcohol use No     Allergies   Patient has no known allergies.   Review of Systems Review of Systems  HENT: Positive for congestion and rhinorrhea.   Respiratory: Positive for cough and wheezing.   Ten systems are reviewed and are negative for acute change except as noted in the HPI   Physical Exam Updated Vital Signs BP 124/56 (BP Location: Left Arm)   Pulse 78   Temp 100.2 F (37.9 C) (Oral)   Resp 18   SpO2 97%   Physical Exam  Constitutional: He is oriented to person, place, and time. He appears well-developed and well-nourished. No distress.  HENT:  Head: Normocephalic and atraumatic.  Nose: Nose normal.  Eyes: Conjunctivae and EOM are normal. Pupils are equal, round, and reactive to light. Right eye exhibits no discharge. Left eye exhibits no discharge. No scleral icterus.  Neck: Normal range of motion. Neck supple.  Cardiovascular: Normal rate and regular rhythm.  Exam reveals no gallop and no friction rub.   No murmur heard. Pulmonary/Chest: Effort normal and breath  sounds normal. No stridor. No respiratory distress. He has no rales.  Abdominal: Soft. He exhibits no distension. There is no tenderness.  Musculoskeletal: He exhibits no edema or tenderness.  Neurological: He is alert and oriented to person, place, and time.  Skin: Skin is warm and dry. No rash noted. He is not diaphoretic. No erythema.  Psychiatric: He has a normal mood and affect.  Vitals reviewed.    ED Treatments / Results  Labs (all labs ordered are listed, but only abnormal results are displayed) Labs Reviewed  COMPREHENSIVE METABOLIC PANEL - Abnormal; Notable for the following:       Result Value    Sodium 131 (*)    Chloride 99 (*)    Glucose, Bld 105 (*)    Calcium 8.4 (*)    ALT 10 (*)    Total Bilirubin 1.4 (*)    All other components within normal limits  CBC WITH DIFFERENTIAL/PLATELET - Abnormal; Notable for the following:    Monocytes Absolute 1.3 (*)    All other components within normal limits  I-STAT CG4 LACTIC ACID, ED    EKG  EKG Interpretation None       Radiology Dg Chest 2 View  Result Date: 11/22/2016 CLINICAL DATA:  SOB, body shaking has gotten worse, runny nose, sore throat. Hx of Parkinson's disease. EXAM: CHEST  2 VIEW COMPARISON:  Chest x-rays dated 12/22/2012 and 12/26/2009. FINDINGS: Study is hypoinspiratory with crowding of the perihilar and bibasilar bronchovascular markings. Suspect superimposed interstitial edema bilaterally. No confluent opacity to suggest a developing pneumonia. No pleural effusion or pneumothorax seen. Heart size and mediastinal contours are stable, given the low lung volumes. Mild degenerative spurring again noted within the kyphotic thoracic spine. No acute or suspicious osseous finding. IMPRESSION: 1. Low lung volumes. At least mild interstitial edema bilaterally, superimposed on the hypoinspiratory changes. 2. No evidence of pneumonia. Electronically Signed   By: Bary Richard M.D.   On: 11/22/2016 19:08    Procedures Procedures (including critical care time)  Medications Ordered in ED Medications - No data to display   Initial Impression / Assessment and Plan / ED Course  I have reviewed the triage vital signs and the nursing notes.  Pertinent labs & imaging results that were available during my care of the patient were reviewed by me and considered in my medical decision making (see chart for details).     Presentation most consistent with flulike illness. Chest x-ray without evidence of pneumonia. Patient is well-appearing and well-hydrated without evidence of toxicity. Labs grossly reassuring. Patient has been able to  tolerate by mouth intake. Discussed Tamiflu and patient declined. Feel he is appropriate for discharge with strict return precautions and close PCP follow-up.  Final Clinical Impressions(s) / ED Diagnoses   Final diagnoses:  Flu-like symptoms   Disposition: Discharge  Condition: Good  I have discussed the results, Dx and Tx plan with the patient who expressed understanding and agree(s) with the plan. Discharge instructions discussed at great length. The patient was given strict return precautions who verbalized understanding of the instructions. No further questions at time of discharge.    New Prescriptions   No medications on file    Follow Up: Devra Dopp, MD [address] [place] [address] [phone]  Schedule an appointment as soon as possible for a visit  in 3-5 days, If symptoms do not improve or  worsen      Nira Conn, MD 11/22/16 2212

## 2017-05-24 ENCOUNTER — Encounter: Payer: Self-pay | Admitting: Physician Assistant

## 2017-05-24 ENCOUNTER — Ambulatory Visit (INDEPENDENT_AMBULATORY_CARE_PROVIDER_SITE_OTHER): Payer: Medicare HMO | Admitting: Physician Assistant

## 2017-05-24 VITALS — BP 107/72 | HR 60 | Temp 98.0°F | Resp 18 | Ht 69.69 in | Wt 192.4 lb

## 2017-05-24 DIAGNOSIS — Z125 Encounter for screening for malignant neoplasm of prostate: Secondary | ICD-10-CM | POA: Diagnosis not present

## 2017-05-24 DIAGNOSIS — H3530 Unspecified macular degeneration: Secondary | ICD-10-CM | POA: Insufficient documentation

## 2017-05-24 DIAGNOSIS — G20 Parkinson's disease: Secondary | ICD-10-CM | POA: Diagnosis not present

## 2017-05-24 DIAGNOSIS — N401 Enlarged prostate with lower urinary tract symptoms: Secondary | ICD-10-CM

## 2017-05-24 DIAGNOSIS — H409 Unspecified glaucoma: Secondary | ICD-10-CM | POA: Diagnosis not present

## 2017-05-24 DIAGNOSIS — E785 Hyperlipidemia, unspecified: Secondary | ICD-10-CM | POA: Diagnosis not present

## 2017-05-24 DIAGNOSIS — F0390 Unspecified dementia without behavioral disturbance: Secondary | ICD-10-CM | POA: Insufficient documentation

## 2017-05-24 DIAGNOSIS — N3941 Urge incontinence: Secondary | ICD-10-CM | POA: Diagnosis not present

## 2017-05-24 DIAGNOSIS — Z0000 Encounter for general adult medical examination without abnormal findings: Secondary | ICD-10-CM

## 2017-05-24 DIAGNOSIS — K219 Gastro-esophageal reflux disease without esophagitis: Secondary | ICD-10-CM

## 2017-05-24 DIAGNOSIS — F0280 Dementia in other diseases classified elsewhere without behavioral disturbance: Secondary | ICD-10-CM | POA: Diagnosis not present

## 2017-05-24 DIAGNOSIS — R2681 Unsteadiness on feet: Secondary | ICD-10-CM | POA: Diagnosis not present

## 2017-05-24 MED ORDER — ROLLATOR MISC: 1.0000 | Freq: Every day | 0 refills | Status: DC

## 2017-05-24 MED ORDER — OMEPRAZOLE 40 MG PO CPDR: 40.0000 mg | DELAYED_RELEASE_CAPSULE | Freq: Every day | ORAL | 1 refills | Status: DC

## 2017-05-24 MED ORDER — FINASTERIDE 5 MG PO TABS: 5.0000 mg | ORAL_TABLET | Freq: Every day | ORAL | 1 refills | Status: AC

## 2017-05-24 MED ORDER — ROLLATOR MISC: 1.0000 | Freq: Every day | 0 refills | Status: AC

## 2017-05-24 NOTE — Progress Notes (Signed)
Presents today for Medicare Visit.   Interpreter used for this visit? No - pt is with his wife who helps with the history  Other items to address today:  1-Toenail fungus - they get the nails cut with a specialist - wondering if a medication 2- red feet -itch and irritates him 3 - Decreased balance - loses balance frequently - uses a cane - he is interested in a walker and a handicapped sticker   4- has parksinson and some memory loss - he sees neurologist for his  Cancer Screening:  Colon (every 10 years - ages 7950-75): yes - next due 11/10/2021 - [place] - within the last 10 years - he has just gotten a letter for him to reschedule Prostate (annually - ages > 4150): yes 2/17 - normal - slight increased  Other screening:  Bone density screening (every 2 years - ages 21>65): unsure US for AAA (males 5365-75 - smoking history - once): no smoking history ETOH use: none Dental visits: every 6 months Vision visits: every 6 months, retinal specialist every week for injections Typical Meals: eat when he is hungry, snacking (peanuts and pretzals) Typical Beverages: gingerale, tea, gatorade Exercise: not really  Lab Screening Last screening for diabetes (annually): 2/17 Last lipid screening (every 5 years): 2/17   ADVANCE DIRECTIVES: Discussed: yes - daughter is power of attorney On File: no Materials Provided: yes  Depression screen PHQ 2/9 05/24/2017  Decreased Interest 0  Down, Depressed, Hopeless 0  PHQ - 2 Score 0    Fall Risk  05/24/2017  Falls in the past year? No    Functional Status Survey: Is the patient deaf or have difficulty hearing?: Yes Does the patient have difficulty seeing, even when wearing glasses/contacts?: Yes Does the patient have difficulty concentrating, remembering, or making decisions?: Yes Does the patient have difficulty walking or climbing stairs?: Yes Does the patient have difficulty dressing or bathing?: No Does the patient have difficulty doing  errands alone such as visiting a doctor's office or shopping?: Yes   Immunization status:  Immunization History  Administered Date(s) Administered  . Pneumococcal Polysaccharide-23 02/07/2012  . Tdap 02/07/2012  . Zoster Recombinat (Shingrix) 01/24/2003    Health Maintenance Due  Topic Date Due  . TETANUS/TDAP  01/23/1962  . COLONOSCOPY  01/23/1993  . PNA vac Low Risk Adult (1 of 2 - PCV13) 01/24/2008  . INFLUENZA VACCINE  05/25/2017    Patient Care Team: Larkin InaWeber, Rayan Dyal L, PA-C as PCP - General (Physician Assistant) Ernesto RutherfordGroat, Robert, MD as Consulting Physician (Ophthalmology)  Candie EchevariaJohn Moore - neurologist Retinal Specialist   Patient Active Problem List   Diagnosis Date Noted  . Glaucoma 05/24/2017  . 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 Parkinson disease (HCC) 05/24/2017  . Dementia 05/24/2017  . 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 Gastroesophageal reflux disease 12/04/2014  . Hyperlipidemia 12/04/2014  . Family history of dementia 07/07/2012  . ASBESTOS EXPOSURE, HX OF 12/27/2008  . LUNG NODULE 08/29/2008  . ALLERGIC RHINITIS 08/02/2008  . DYSPNEA 07/25/2008     Past Medical History:  Diagnosis Date  . BPH (benign prostatic hyperplasia)   . Cataract   . GERD (gastroesophageal reflux disease)   . Glaucoma   . Hyperlipemia   . Parkinson disease [facility]HCC)      Past Surgical History:  Procedure Laterality Date  . EYE SURGERY    . HERNIA REPAIR    . NECK SURGERY  Family History  Problem Relation Age of Onset  . Mental retardation Mother      Social History   Social History  . Marital status: Married    Spouse name: N/A  . Number of children: N/A  . Years of education: N/A   Occupational History  . Not on file.   Social History Main Topics  . Smoking status: Never Smoker  . Smokeless tobacco: Never Used  . Alcohol use No  . Drug use: No  . Sexual activity: Not on file   Other Topics Concern    . Not on file   Social History Narrative  . No narrative on file     No Known Allergies  Prior to Admission medications   Medication Sig Start Date End Date Taking? Authorizing Provider  aspirin 81 MG tablet Take 81 mg by mouth daily.   Yes [provider]  dorzolamide (TRUSOPT) 2 % ophthalmic solution Place 1 drop into both eyes 3 (three) times daily.   Yes [provider]  HYDROcodone-acetaminophen (NORCO/VICODIN) 5-325 MG per tablet Take 1 tablet by mouth every 8 (eight) hours as needed for pain. 12/22/12  Yes Gerhard Munch, MD  latanoprost (XALATAN) 0.005 % ophthalmic solution Place 1 drop into both eyes at bedtime.   Yes [provider]  Multiple Vitamin (MULTIVITAMIN WITH MINERALS) TABS Take 1 tablet by mouth daily.   Yes [provider]  niacin (NIASPAN) 500 MG CR tablet Take 500 mg by mouth at bedtime.   Yes [provider]  omeprazole (PRILOSEC OTC) 20 MG tablet Take 20 mg by mouth daily.   Yes [provider]  timolol (BETIMOL) 0.25 % ophthalmic solution Place 1-2 drops into both eyes 2 (two) times daily.   Yes [provider]      PHYSICAL EXAM: BP 107/72   Pulse 60   Temp 98 F (36.7 C) (Oral)   Resp 18   Ht 5' 9.69" (1.77 m)   Wt 192 lb 6.4 oz (87.3 kg)   SpO2 100%   BMI 27.86 kg/m   Wt Readings from Last 3 Encounters:  05/24/17 192 lb 6.4 oz (87.3 kg)  12/30/10 215 lb 4 oz (97.6 kg)  12/26/09 211 lb 6.1 oz (95.9 kg)     Visual Acuity Screening   Right eye Left eye Both eyes  Without correction:     With correction: 20/50 20/40-2 20-2    Physical Exam  Vitals reviewed. Constitutional: He is oriented to person, place, and time. He appears well-developed and well-nourished.  HENT:  Head: Normocephalic and atraumatic.  Right Ear: External ear normal.  Left Ear: External ear normal.  Nose: Nose normal.  Mouth/Throat: Oropharynx is clear and moist.  Eyes: Pupils are equal, round, and  reactive to light. Conjunctivae and EOM are normal.  Neck: Normal range of motion. Neck supple. No thyroid mass and no thyromegaly present.  Cardiovascular: Normal rate, regular rhythm and normal heart sounds.   No murmur heard. Respiratory: Effort normal and breath sounds normal. He has no wheezes.  GI: Soft. Bowel sounds are normal.  Musculoskeletal: Normal range of motion.  Neurological: He is alert and oriented to person, place, and time. He has normal reflexes. Coordination normal.  Unsteady gait  Skin: Skin is warm and dry.  Psychiatric: He has a normal mood and affect. His behavior is normal. Thought content normal.    Education/Counseling: yes diet and exercise yes prevention of chronic diseases Pt does not smoke smoking/tobacco cessation yes  review "Covered Medicare Preventive Services"    ASSESSMENT/PLAN: Medicare annual wellness visit, subsequent - Plan: PSA  Glaucoma of both eyes, unspecified glaucoma type  Parkinson disease (HCC) - Plan: Misc. Devices (ROLLATOR) MISC, DISCONTINUED: Misc. Devices (ROLLATOR) MISC  Dementia due to Parkinson's disease without behavioral disturbance (HCC)  Macular degeneration of both eyes, unspecified type  Generally unsteady - Plan: Misc. Devices (ROLLATOR) MISC, DISCONTINUED: Misc. Devices (ROLLATOR) MISC  Hyperlipidemia, unspecified hyperlipidemia type - Plan: Lipid panel  Screening for prostate cancer - Plan: PSA  Gastroesophageal reflux disease, esophagitis presence not specified - Plan: omeprazole (PRILOSEC) 40 MG capsule  Benign prostatic hyperplasia (BPH) with urinary urge incontinence - Plan: finasteride (PROSCAR) 5 MG tablet  Pt to get copies of advance directives for his chart.  Continue current medciations.  Recheck within a month for discussion of his feet problems and to recheck his balance issues and comfort with rollating walker which I think would be helpful for the patient.  A handicap placard card filled out for  patient.

## 2017-05-24 NOTE — Patient Instructions (Addendum)
OTC cortisone cream for your hemorrhoids.  Make an appointment for your feet and nails.   IF you received an x-ray today, you will receive an invoice from [facility]. Please contact [facility] at [phone] with questions or concerns regarding your invoice.   IF you received labwork today, you will receive an invoice from [facility]. Please contact [facility] at [phone] with questions or concerns regarding your invoice.   Our billing staff will not be able to assist you with questions regarding bills from these companies.  You will be contacted with the lab results as soon as they are available. The fastest way to get your results is to activate your My Chart account. Instructions are located on the last page of this paperwork. If you have not heard from us regarding the results in 2 weeks, please contact this office.     Advance Directive Advance directives are legal documents that let you make choices ahead of time about your health care and medical treatment in case you become unable to communicate for yourself. Advance directives are a way for you to communicate your wishes to family, friends, and health care providers. This can help convey your decisions about end-of-life care if you become unable to communicate. Discussing and writing advance directives should happen over time rather than all at once. Advance directives can be changed depending on your situation and what you want, even after you have signed the advance directives. If you do not have an advance directive, some states assign family decision makers to act on your behalf based on how closely you are related to them. Each state has its own laws regarding advance directives. You may want to check with your health care provider, attorney, or state representative about the laws in your state. There are different types of advance directives, such as:  Medical power of attorney.  Living will.  Do not  resuscitate (DNR) or do not attempt resuscitation (DNAR) order.  Health care proxy and medical power of attorney A health care proxy, also called a health care agent, is a person who is appointed to make medical decisions for you in cases in which you are unable to make the decisions yourself. Generally, people choose someone they know well and trust to represent their preferences. Make sure to ask this person for an agreement to act as your proxy. A proxy may have to exercise judgment in the event of a medical decision for which your wishes are not known. A medical power of attorney is a legal document that names your health care proxy. Depending on the laws in your state, after the document is written, it may also need to be:  Signed.  Notarized.  Dated.  Copied.  Witnessed.  Incorporated into your medical record.  You may also want to appoint someone to manage your financial affairs in a situation in which you are unable to do so. This is called a durable power of attorney for finances. It is a separate legal document from the durable power of attorney for health care. You may choose the same person or someone different from your health care proxy to act as your agent in financial matters. If you do not appoint a proxy, or if there is a concern that the proxy is not acting in your best interests, a court-appointed guardian may be designated to act on your behalf. Living will A living will is a set of instructions documenting your wishes about medical care when you cannot express  them yourself. Health care providers should keep a copy of your living will in your medical record. You may want to give a copy to family members or friends. To alert caregivers in case of an emergency, you can place a card in your wallet to let them know that you have a living will and where they can find it. A living will is used if you become:  Terminally ill.  Incapacitated.  Unable to communicate or make  decisions.  Items to consider in your living will include:  The use or non-use of life-sustaining equipment, such as dialysis machines and breathing machines (ventilators).  A DNR or DNAR order, which is the instruction not to use cardiopulmonary resuscitation (CPR) if breathing or heartbeat stops.  The use or non-use of tube feeding.  Withholding of food and fluids.  Comfort (palliative) care when the goal becomes comfort rather than a cure.  Organ and tissue donation.  A living will does not give instructions for distributing your money and property if you should pass away. It is recommended that you seek the advice of a lawyer when writing a will. Decisions about taxes, beneficiaries, and asset distribution will be legally binding. This process can relieve your family and friends of any concerns surrounding disputes or questions that may come up about the distribution of your assets. DNR or DNAR A DNR or DNAR order is a request not to have CPR in the event that your heart stops beating or you stop breathing. If a DNR or DNAR order has not been made and shared, a health care provider will try to help any patient whose heart has stopped or who has stopped breathing. If you plan to have surgery, talk with your health care provider about how your DNR or DNAR order will be followed if problems occur. Summary  Advance directives are the legal documents that allow you to make choices ahead of time about your health care and medical treatment in case you become unable to communicate for yourself.  The process of discussing and writing advance directives should happen over time. You can change the advance directives, even after you have signed them.  Advance directives include DNR or DNAR orders, living wills, and designating an agent as your medical power of attorney. This information is not intended to replace advice given to you by your health care provider. Make sure you discuss any questions  you have with your health care provider. Document Released: 01/18/2008 Document Revised: 08/30/2016 Document Reviewed: 08/30/2016 Elsevier Interactive Patient Education  2017 [employer]

## 2017-05-25 ENCOUNTER — Encounter: Payer: Self-pay | Admitting: Physician Assistant

## 2017-05-25 LAB — LIPID PANEL
Chol/HDL Ratio: 5.2 ratio — ABNORMAL HIGH (ref 0.0–5.0)
Cholesterol, Total: 192 mg/dL (ref 100–199)
HDL: 37 mg/dL — ABNORMAL LOW (ref >39)
LDL Calculated: 121 mg/dL — ABNORMAL HIGH (ref 0–99)
Triglycerides: 169 mg/dL — ABNORMAL HIGH (ref 0–149)
VLDL CHOLESTEROL CAL: 34 mg/dL (ref 5–40)

## 2017-05-25 LAB — PSA: PROSTATE SPECIFIC AG, SERUM: 1.9 ng/mL (ref 0.0–4.0)

## 2017-06-21 IMAGING — CR DG CHEST 2V
2 series · 2 of 2 positions shown · non-contrast
Comparison: Chest x-rays dated 12/22/2012 and 12/26/2009.

CLINICAL DATA: SOB, body shaking has gotten worse, runny nose, sore
throat. Hx of Parkinson's disease.

EXAM:
CHEST  2 VIEW

[w chest lat]
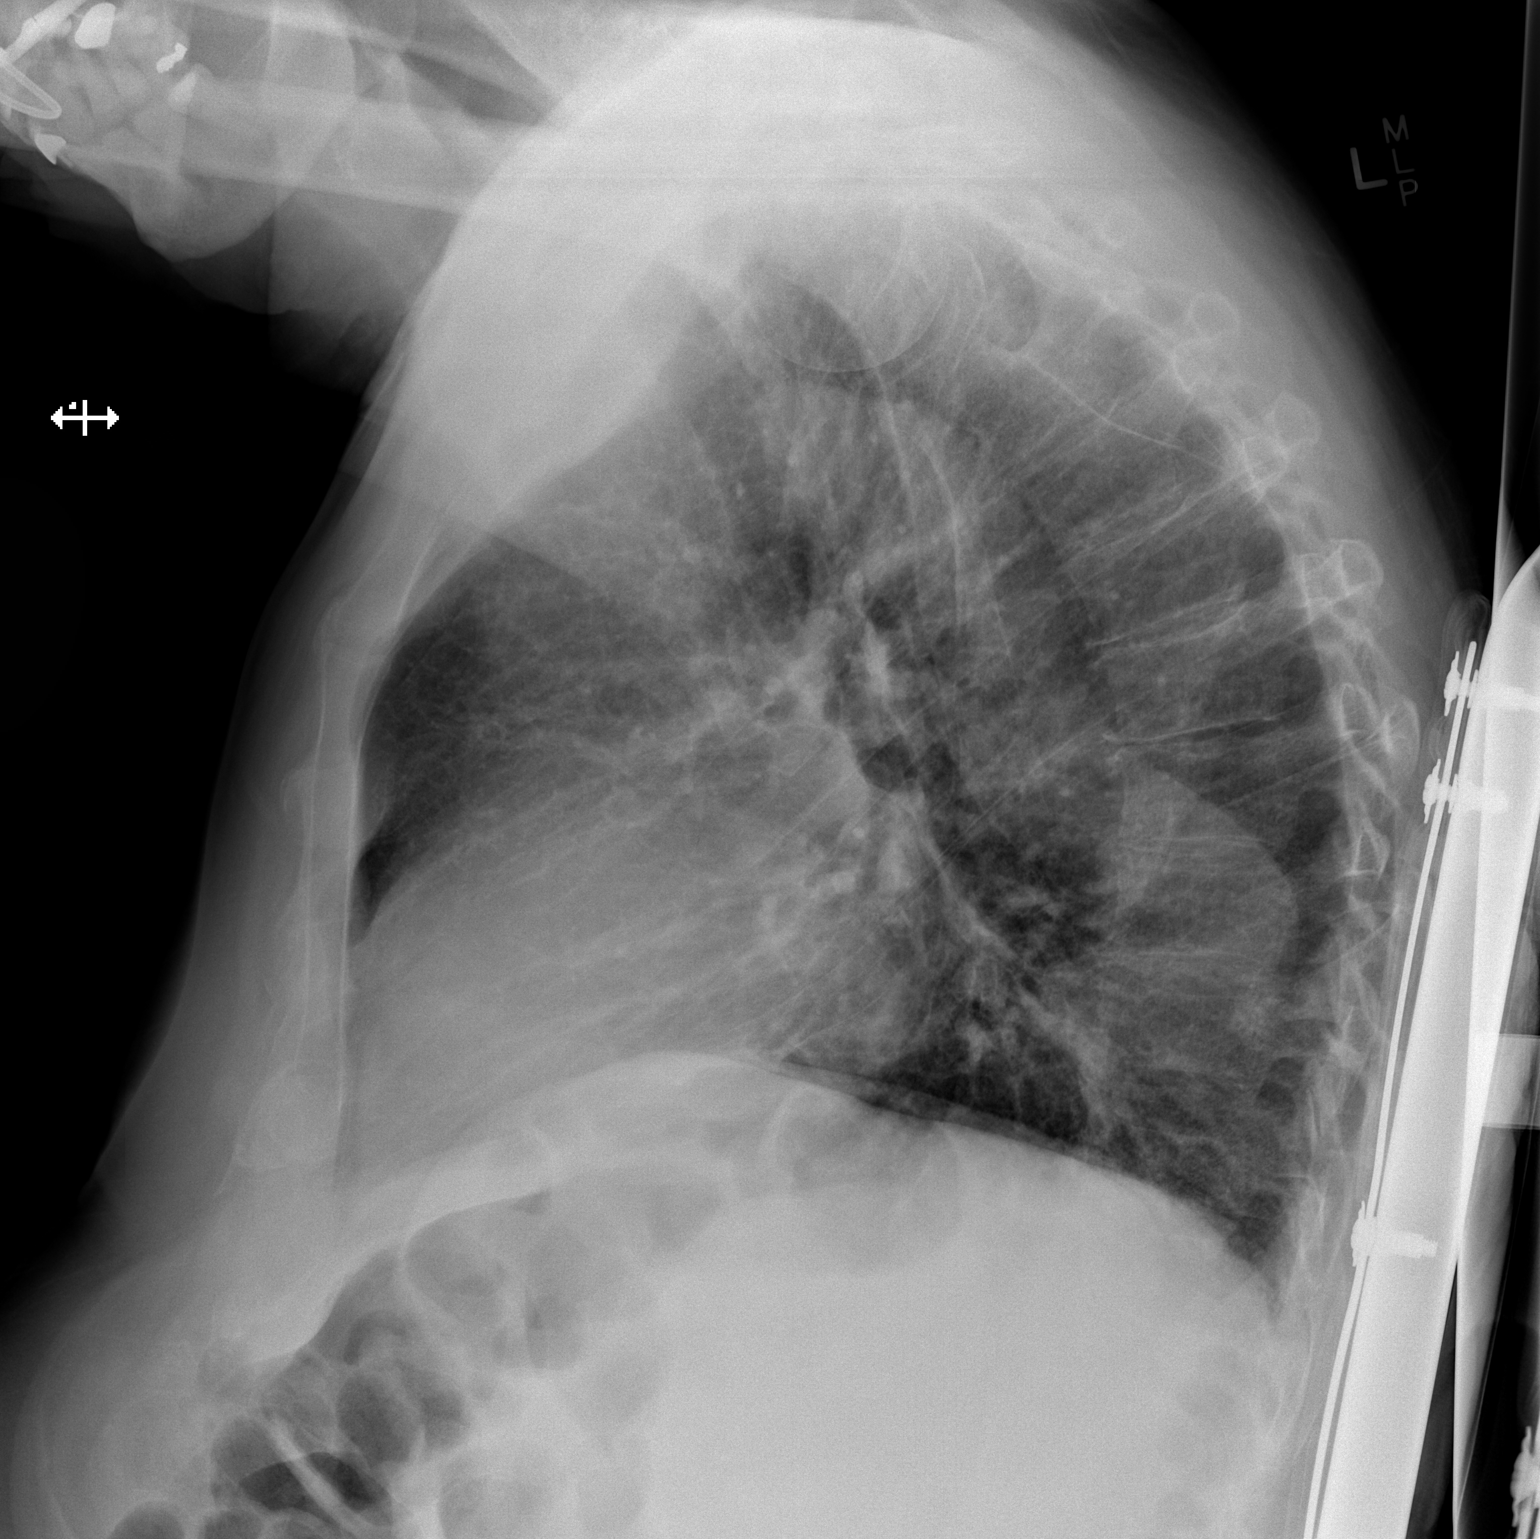

[x chest ap]
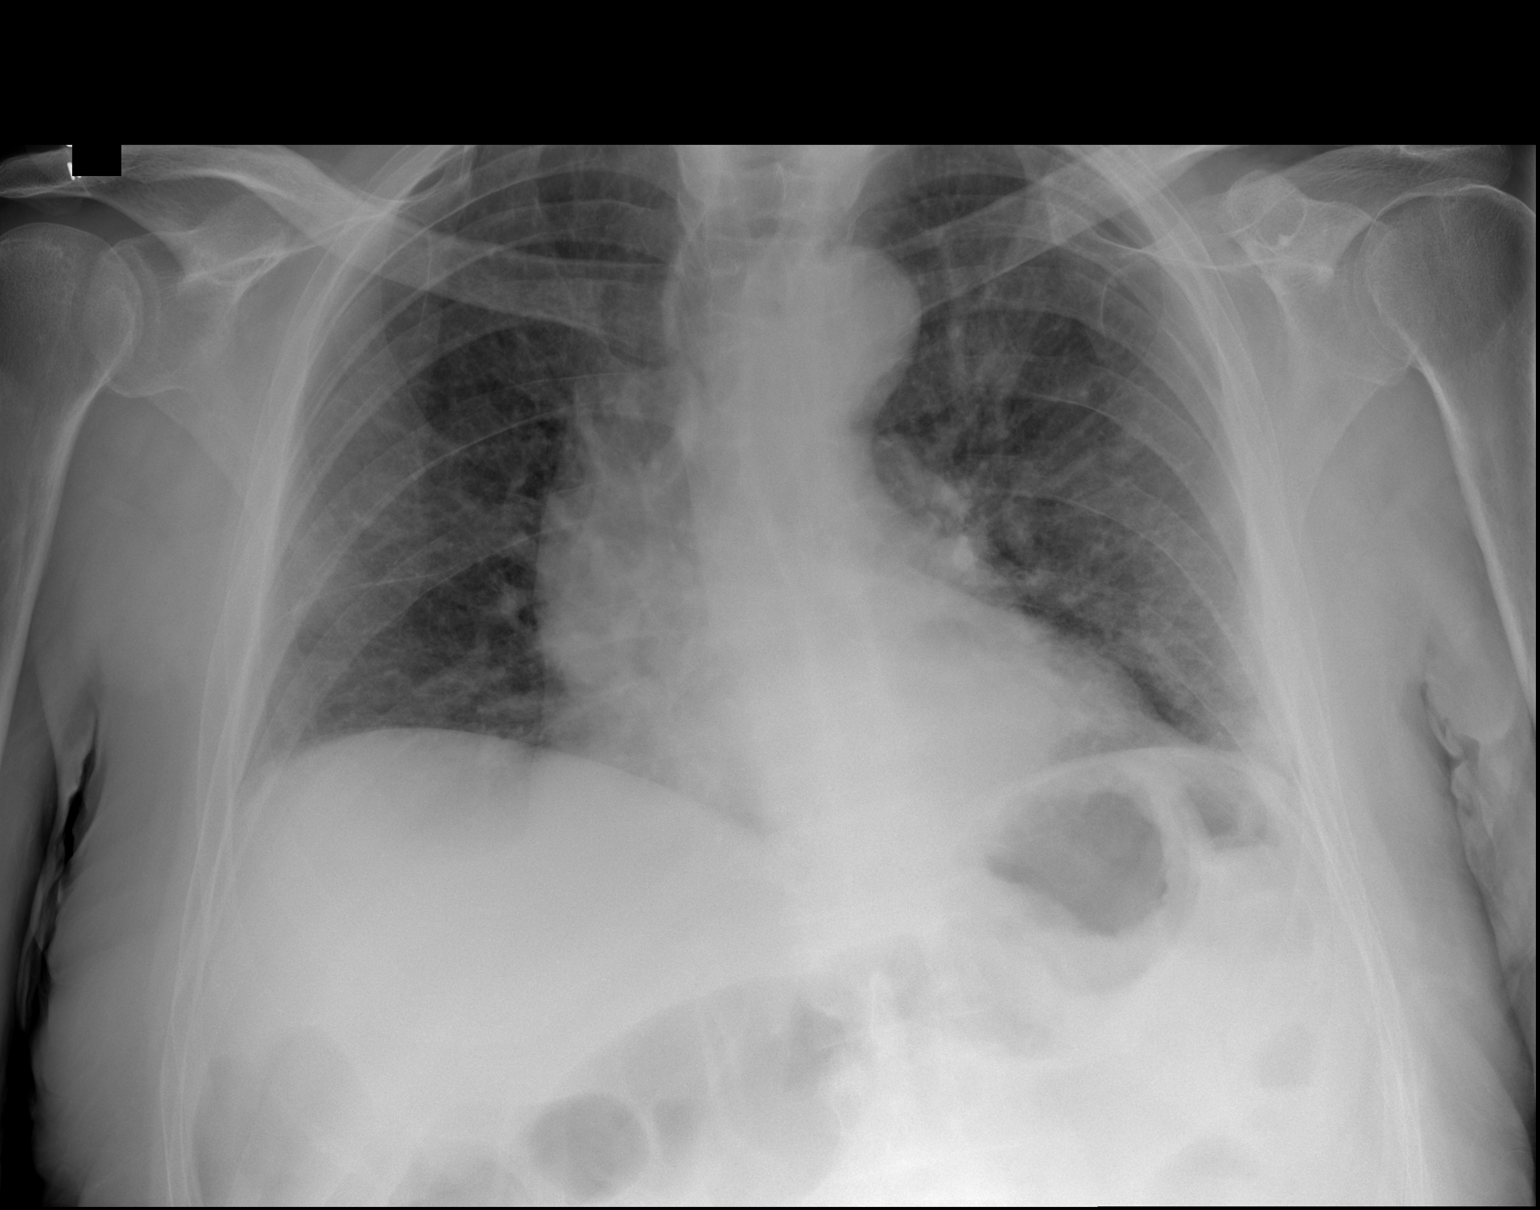

[2 of 2 positions shown; findings below may reference images not displayed]

FINDINGS: Study is hypoinspiratory with crowding of the perihilar and
bibasilar bronchovascular markings. Suspect superimposed
interstitial edema bilaterally. No confluent opacity to suggest a
developing pneumonia. No pleural effusion or pneumothorax seen.

Heart size and mediastinal contours are stable, given the low lung
volumes. Mild degenerative spurring again noted within the kyphotic
thoracic spine. No acute or suspicious osseous finding.
IMPRESSION: 1. Low lung volumes. At least mild interstitial edema bilaterally,
superimposed on the hypoinspiratory changes.
2. No evidence of pneumonia.

## 2017-06-23 ENCOUNTER — Ambulatory Visit: Payer: Medicare HMO | Admitting: Physician Assistant

## 2017-06-24 ENCOUNTER — Ambulatory Visit (INDEPENDENT_AMBULATORY_CARE_PROVIDER_SITE_OTHER): Payer: Medicare HMO | Admitting: Physician Assistant

## 2017-06-24 ENCOUNTER — Encounter: Payer: Self-pay | Admitting: Physician Assistant

## 2017-06-24 VITALS — BP 115/74 | HR 63 | Temp 97.5°F | Resp 17 | Ht 67.0 in | Wt 192.0 lb

## 2017-06-24 DIAGNOSIS — R21 Rash and other nonspecific skin eruption: Secondary | ICD-10-CM

## 2017-06-24 DIAGNOSIS — L853 Xerosis cutis: Secondary | ICD-10-CM

## 2017-06-24 LAB — POCT SKIN KOH: SKIN KOH, POC: NEGATIVE

## 2017-06-24 MED ORDER — BETAMETHASONE DIPROPIONATE 0.05 % EX CREA: TOPICAL_CREAM | Freq: Two times a day (BID) | CUTANEOUS | 1 refills | Status: DC

## 2017-06-24 NOTE — Patient Instructions (Addendum)
     IF you received an x-ray today, you will receive an invoice from [facility]. Please contact [facility] at [phone] with questions or concerns regarding your invoice.   IF you received labwork today, you will receive an invoice from [facility]. Please contact [facility] at [phone] with questions or concerns regarding your invoice.   Our billing staff will not be able to assist you with questions regarding bills from these companies.  You will be contacted with the lab results as soon as they are available. The fastest way to get your results is to activate your My Chart account. Instructions are located on the last page of this paperwork. If you have not heard from us regarding the results in 2 weeks, please contact this office.     Use good moisturizer on your feet.  Use the cream I sent to the pharmacy to help with the dry skin.

## 2017-06-24 NOTE — Addendum Note (Signed)
Addended by: Morrell RiddleWEBER, Taylinn Brabant L on: 06/24/2017 11:08 AM   Modules accepted: Level of Service

## 2017-06-24 NOTE — Progress Notes (Signed)
Phillip Acosta  MRN: [id number] DOB: 11/16/1940  PCP: Morrell RiddleWeber, Sahas Sluka L, PA-C  Chief Complaint  Patient presents with  . Follow-up    Subjective:  Pt presents to clinic for rash on feet and thick toenails.  He has had this problem for a long time.  His feet itch and are really dry.  He mainly only wears socks in the house.    He has gotten the rollating walker but he has not used it much - their house is not really set up for that - he thinks that he mainly will use it while out in stores for stability.  History is obtained by patient and wife.  Review of Systems  Constitutional: Negative for chills and fever.  Skin: Positive for rash.    Patient Active Problem List   Diagnosis Date Noted  . Glaucoma 05/24/2017  . 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 Parkinson disease (HCC) 05/24/2017  . Dementia 05/24/2017  . 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 Gastroesophageal reflux disease 12/04/2014  . Hyperlipidemia 12/04/2014  . Family history of dementia 07/07/2012  . ASBESTOS EXPOSURE, HX OF 12/27/2008  . LUNG NODULE 08/29/2008  . ALLERGIC RHINITIS 08/02/2008  . DYSPNEA 07/25/2008    Current Outpatient Prescriptions on File Prior to Visit  Medication Sig Dispense Refill  . acetaZOLAMIDE (DIAMOX) 250 MG tablet TAKE 1 TABLET BY MOUTH TWICE A DAY    . aspirin 81 MG tablet Take 81 mg by mouth daily.    . carbidopa-levodopa (SINEMET IR) 25-100 MG tablet Take by mouth.    . donepezil (ARICEPT) 10 MG tablet TAKE 1 TABLET EVERY MORNING    . dorzolamide (TRUSOPT) 2 % ophthalmic solution Place 1 drop into both eyes 3 (three) times daily.    . finasteride (PROSCAR) 5 MG tablet Take 1 tablet (5 mg total) by mouth daily. 90 tablet 1  . latanoprost (XALATAN) 0.005 % ophthalmic solution Place 1 drop into both eyes at bedtime.    . Misc. Devices (ROLLATOR) MISC 1 Device by Does not apply route daily. 1 each 0  . montelukast (SINGULAIR) 10 MG  tablet TAKE 1 TABLET AT BEDTIME    . Multiple Vitamin (MULTIVITAMIN WITH MINERALS) TABS Take 1 tablet by mouth daily.    . niacin (NIASPAN) 500 MG CR tablet Take 500 mg by mouth at bedtime.    Marland Kitchen. omeprazole (PRILOSEC) 40 MG capsule Take 1 capsule (40 mg total) by mouth daily. 90 capsule 1  . timolol (BETIMOL) 0.25 % ophthalmic solution Place 1-2 drops into both eyes 2 (two) times daily.     No current facility-administered medications on file prior to visit.     No Known Allergies  Past Medical History:  Diagnosis Date  . BPH (benign prostatic hyperplasia)   . Cataract   . GERD (gastroesophageal reflux disease)   . Glaucoma   . Hyperlipemia   . Parkinson disease [facility](HCC)    Social History   Social History Narrative   Lives with wife   Social History  Substance Use Topics  . Smoking status: Never Smoker  . Smokeless tobacco: Never Used  . Alcohol use No   family history includes Mental retardation in his mother.     Objective:  BP 115/74   Pulse 63   Temp (!) 97.5 F (36.4 C) (Oral)   Resp 17   Ht 5\' 7"  (1.702 m)   Wt 192 lb (87.1 kg)   SpO2 98%  BMI 30.07 kg/m  Body mass index is 30.07 kg/m.  Physical Exam  Constitutional: He is oriented to person, place, and time and well-developed, well-nourished, and in no distress.  HENT:  Head: Normocephalic and atraumatic.  Right Ear: External ear normal.  Left Ear: External ear normal.  Eyes: Conjunctivae are normal.  Neck: Normal range of motion.  Pulmonary/Chest: Effort normal.  Neurological: He is alert and oriented to person, place, and time. Gait normal.  Skin: Skin is warm and dry.  Erythematous dry skin on feet - worse on the right than the left - onychomycotic nails bilaterally - worse on the left  Psychiatric: Mood, memory, affect and judgment normal.    Results for orders placed or performed in visit on 06/24/17  POCT Skin KOH  Result Value Ref Range   Skin KOH, POC Negative Negative     Assessment  and Plan :  Rash - Plan: POCT Skin KOH  Dry skin dermatitis - Plan: betamethasone dipropionate (DIPROLENE) 0.05 % cream   No fungus on skin scraping - will treat with moisturizer and steroid for inflammation.    Benny Lennert PA-C  Primary Care at [facility] 06/24/2017 10:12 AM

## 2017-08-02 ENCOUNTER — Telehealth: Payer: Self-pay | Admitting: Physician Assistant

## 2017-08-02 NOTE — Telephone Encounter (Signed)
Pt wife is calling to get a better understanding of the refill request for finasteride  they received a letter stating that it was denied but [facility] is calling stating that it ready to pick up   Best number [phone]

## 2017-08-04 ENCOUNTER — Telehealth: Payer: Self-pay | Admitting: Physician Assistant

## 2017-08-04 NOTE — Telephone Encounter (Signed)
LVM to call back.

## 2017-08-04 NOTE — Telephone Encounter (Signed)
Attempted to call - VM not set up.

## 2017-08-04 NOTE — Telephone Encounter (Signed)
Pt wife called back and stated that she had to go some where else to get her answer since it took [place] two days without a call back so she is letting us know we do not need to call her now she has what she needed about her husbands medication  [phone]

## 2017-08-12 ENCOUNTER — Encounter: Payer: Self-pay | Admitting: Physician Assistant

## 2017-08-12 ENCOUNTER — Ambulatory Visit (INDEPENDENT_AMBULATORY_CARE_PROVIDER_SITE_OTHER): Payer: Medicare HMO | Admitting: Physician Assistant

## 2017-08-12 VITALS — BP 116/74 | HR 64 | Temp 97.6°F | Resp 18 | Ht 67.0 in | Wt 192.0 lb

## 2017-08-12 DIAGNOSIS — R252 Cramp and spasm: Secondary | ICD-10-CM | POA: Diagnosis not present

## 2017-08-12 DIAGNOSIS — E785 Hyperlipidemia, unspecified: Secondary | ICD-10-CM | POA: Diagnosis not present

## 2017-08-12 DIAGNOSIS — R209 Unspecified disturbances of skin sensation: Secondary | ICD-10-CM | POA: Diagnosis not present

## 2017-08-12 DIAGNOSIS — F0280 Dementia in other diseases classified elsewhere without behavioral disturbance: Secondary | ICD-10-CM | POA: Diagnosis not present

## 2017-08-12 DIAGNOSIS — R0989 Other specified symptoms and signs involving the circulatory and respiratory systems: Secondary | ICD-10-CM

## 2017-08-12 DIAGNOSIS — L853 Xerosis cutis: Secondary | ICD-10-CM | POA: Diagnosis not present

## 2017-08-12 DIAGNOSIS — G20 Parkinson's disease: Secondary | ICD-10-CM | POA: Diagnosis not present

## 2017-08-12 MED ORDER — TRIAMCINOLONE ACETONIDE 0.5 % EX CREA: 1.0000 "application " | TOPICAL_CREAM | Freq: Two times a day (BID) | CUTANEOUS | 0 refills | Status: DC

## 2017-08-12 NOTE — Progress Notes (Signed)
Phillip Acosta  MRN: [id number] DOB: 22-Oct-1941  PCP: Mancel Bale, PA-C  Chief Complaint  Patient presents with  . Rash    on feet follow up     Subjective:  Pt presents to clinic for concerns that the rash on his feet is not getting better.  His wife has been putting on the cream that he got at his last visit for dry skin and his feet are less dry but she has noticed that they are more red/purple over the last week.  The house has been colder with the recent decrease in temperature.  He does not walk much - he does go to the store but he tends to sit and not walk around - he does complain of some cramping in his calves that does seem to get better with rest - his wife says that she has not heard his complain of this at home.    History is obtained by patient.  Review of Systems  Musculoskeletal: Positive for myalgias.  Skin: Positive for rash. Negative for wound.    Patient Active Problem List   Diagnosis Date Noted  . Glaucoma 05/24/2017  . 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 Parkinson disease ([place]) 05/24/2017  . Dementia 05/24/2017  . 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 Gastroesophageal reflux disease 12/04/2014  . Hyperlipidemia 12/04/2014  . Family history of dementia 07/07/2012  . ASBESTOS EXPOSURE, HX OF 12/27/2008  . LUNG NODULE 08/29/2008  . ALLERGIC RHINITIS 08/02/2008  . DYSPNEA 07/25/2008    Current Outpatient Prescriptions on File Prior to Visit  Medication Sig Dispense Refill  . acetaZOLAMIDE (DIAMOX) 250 MG tablet TAKE 1 TABLET BY MOUTH TWICE A DAY    . aspirin 81 MG tablet Take 81 mg by mouth daily.    . carbidopa-levodopa (SINEMET IR) 25-100 MG tablet Take by mouth.    . donepezil (ARICEPT) 10 MG tablet TAKE 1 TABLET EVERY MORNING    . dorzolamide (TRUSOPT) 2 % ophthalmic solution Place 1 drop into both eyes 3 (three) times daily.    . finasteride (PROSCAR) 5 MG tablet Take 1 tablet (5 mg total) by  mouth daily. 90 tablet 1  . latanoprost (XALATAN) 0.005 % ophthalmic solution Place 1 drop into both eyes at bedtime.    . Misc. Devices (ROLLATOR) MISC 1 Device by Does not apply route daily. 1 each 0  . montelukast (SINGULAIR) 10 MG tablet TAKE 1 TABLET AT BEDTIME    . Multiple Vitamin (MULTIVITAMIN WITH MINERALS) TABS Take 1 tablet by mouth daily.    . niacin (NIASPAN) 500 MG CR tablet Take 500 mg by mouth at bedtime.    Marland Kitchen omeprazole (PRILOSEC) 40 MG capsule Take 1 capsule (40 mg total) by mouth daily. 90 capsule 1  . timolol (BETIMOL) 0.25 % ophthalmic solution Place 1-2 drops into both eyes 2 (two) times daily.     No current facility-administered medications on file prior to visit.     No Known Allergies  Past Medical History:  Diagnosis Date  . BPH (benign prostatic hyperplasia)   . Cataract   . GERD (gastroesophageal reflux disease)   . Glaucoma   . Hyperlipemia   . Parkinson disease [facility])    Social History   Social History Narrative   Lives with wife   Social History  Substance Use Topics  . Smoking status: Never Smoker  . Smokeless tobacco: Never Used  . Alcohol use No   family history  includes Mental retardation in his mother.     Objective:  BP 116/74   Pulse 64   Temp 97.6 F (36.4 C) (Oral)   Resp 18   Ht '5\' 7"'  (1.702 m)   Wt 192 lb (87.1 kg)   SpO2 96%   BMI 30.07 kg/m  Body mass index is 30.07 kg/m.  Physical Exam  Constitutional: He is oriented to person, place, and time and well-developed, well-nourished, and in no distress.  HENT:  Head: Normocephalic and atraumatic.  Right Ear: External ear normal.  Left Ear: External ear normal.  Eyes: Conjunctivae are normal.  Neck: Normal range of motion.  Cardiovascular: Normal rate, regular rhythm and normal heart sounds.  Exam reveals decreased pulses (Dp decreased R>L).   No murmur heard. Decrease capillary refill in bilateral feet -  Cold toes bilaterally  Pulmonary/Chest: Effort normal and  breath sounds normal. He has no wheezes.  Neurological: He is alert and oriented to person, place, and time. Gait normal.  Skin: Skin is warm and dry. Rash (dry skin - much improved from last visit ) noted.  No lesions on feet - bilateral great nails thickened and yellowed consistent with onychomycosis.  Psychiatric: Mood, memory, affect and judgment normal.        Assessment and Plan :  Dry skin dermatitis - Plan: triamcinolone cream (KENALOG) 0.5 % - improved with use of diprolene - the diprolene was expensive and we will decrease potency of steroid now that the dryness is improved for hopefully a cheaper alternative for the patient  Cramping of feet - Plan: CMP14+EGFR - check labs - ? Related to likely peripheral artery disease  Bilateral cold feet - Plan: Ambulatory referral to Vascular Surgery - d/w pt treatment for likely PAD is surgery which may not be the best alternative and this was discussed with patient and wife but I do think that it is worth possibility doing ABI to determine what stage of disease the patient has.  He is currently on ASA 68m which he will continue for now.  They will be careful to reduce chances of injuries occurring to feet leading to wounds which may be slow to heal.  Parkinson disease ([place]  Dementia due to Parkinson's disease without behavioral disturbance ([place]  Hyperlipidemia, unspecified hyperlipidemia type - on medications and fair control at last labs in 7/18  Diminished pulses in lower extremity - Plan: Ambulatory referral to Vascular Surgery  D/w Dr SKathy BreachPA-C  [place]10/19/2018 3:37 PM

## 2017-08-12 NOTE — Patient Instructions (Signed)
     IF you received an x-ray today, you will receive an invoice from [facility]. Please contact [facility] at [phone] with questions or concerns regarding your invoice.   IF you received labwork today, you will receive an invoice from [facility]. Please contact [facility] at [phone] with questions or concerns regarding your invoice.   Our billing staff will not be able to assist you with questions regarding bills from these companies.  You will be contacted with the lab results as soon as they are available. The fastest way to get your results is to activate your My Chart account. Instructions are located on the last page of this paperwork. If you have not heard from us regarding the results in 2 weeks, please contact this office.     

## 2017-08-13 LAB — CMP14+EGFR
A/G RATIO: 1.9 (ref 1.2–2.2)
ALT: 7 IU/L (ref 0–44)
AST: 13 IU/L (ref 0–40)
Albumin: 4.3 g/dL (ref 3.5–4.8)
Alkaline Phosphatase: 63 IU/L (ref 39–117)
BUN/Creatinine Ratio: 16 (ref 10–24)
BUN: 17 mg/dL (ref 8–27)
Bilirubin Total: 0.6 mg/dL (ref 0.0–1.2)
CALCIUM: 9.5 mg/dL (ref 8.6–10.2)
CO2: 22 mmol/L (ref 20–29)
CREATININE: 1.07 mg/dL (ref 0.76–1.27)
Chloride: 100 mmol/L (ref 96–106)
GFR, EST AFRICAN AMERICAN: 79 mL/min/{1.73_m2} (ref >59)
GFR, EST NON AFRICAN AMERICAN: 68 mL/min/{1.73_m2} (ref >59)
Globulin, Total: 2.3 g/dL (ref 1.5–4.5)
Glucose: 93 mg/dL (ref 65–99)
POTASSIUM: 4.8 mmol/L (ref 3.5–5.2)
Sodium: 139 mmol/L (ref 134–144)
TOTAL PROTEIN: 6.6 g/dL (ref 6.0–8.5)

## 2017-08-17 ENCOUNTER — Other Ambulatory Visit: Payer: Self-pay

## 2017-08-17 ENCOUNTER — Ambulatory Visit (HOSPITAL_COMMUNITY)
Admission: RE | Admit: 2017-08-17 | Discharge: 2017-08-17 | Disposition: A | Discharge status: Home / Self Care | Payer: Medicare HMO | Source: Ambulatory Visit | Attending: Vascular Surgery | Admitting: Vascular Surgery

## 2017-08-17 DIAGNOSIS — R0989 Other specified symptoms and signs involving the circulatory and respiratory systems: Secondary | ICD-10-CM | POA: Insufficient documentation

## 2017-08-17 DIAGNOSIS — R209 Unspecified disturbances of skin sensation: Secondary | ICD-10-CM | POA: Diagnosis not present

## 2017-08-18 ENCOUNTER — Ambulatory Visit (INDEPENDENT_AMBULATORY_CARE_PROVIDER_SITE_OTHER): Payer: Medicare HMO | Admitting: Vascular Surgery

## 2017-08-18 ENCOUNTER — Encounter: Payer: Self-pay | Admitting: Vascular Surgery

## 2017-08-18 VITALS — BP 124/75 | HR 55 | Temp 97.3°F | Resp 14 | Ht 73.0 in | Wt 192.0 lb

## 2017-08-18 DIAGNOSIS — I739 Peripheral vascular disease, unspecified: Secondary | ICD-10-CM

## 2017-08-18 NOTE — Progress Notes (Signed)
Patient name: Phillip Acosta MRN: [id number] DOB: 1941-01-02 Sex: male   REASON FOR CONSULT:    Bilateral cold feet.  Consult is requested by Phillip Romans, PA  HPI:   Phillip Acosta is a pleasant 76 y.o. male, who is referred for evaluation of discoloration of both feet.  I have reviewed the note that was sent from the referring office.  Patient was seen on 08/12/2017 with some discoloration of his feet.  They have been red/purple over the last week.  It was felt that the patient likely has some dermatitis secondary to dry skin and the ointment was prescribed.  However the feet were also noted to be cold reason vascular surgery referral was requested.  I do not get any history of claudication.  However, he has a history of Parkinson's disease and his activity is very limited.  He denies any history of rest pain or history of nonhealing ulcers.  He is unaware of any history of DVT or phlebitis.  He is not a smoker.  Past Medical History:  Diagnosis Date  . BPH (benign prostatic hyperplasia)   . Cataract   . GERD (gastroesophageal reflux disease)   . Glaucoma   . Hyperlipemia   . Parkinson disease ([facility])     Family History  Problem Relation Age of Onset  . Mental retardation Mother     SOCIAL HISTORY: Social History   Social History  . Marital status: Married    Spouse name: N/A  . Number of children: N/A  . Years of education: N/A   Occupational History  . retired    Social History Main Topics  . Smoking status: Never Smoker  . Smokeless tobacco: Never Used  . Alcohol use No  . Drug use: No  . Sexual activity: No   Other Topics Concern  . Not on file   Social History Narrative   Lives with wife    No Known Allergies  Current Outpatient Prescriptions  Medication Sig Dispense Refill  . acetaZOLAMIDE (DIAMOX) 250 MG tablet TAKE 1 TABLET BY MOUTH TWICE A DAY    . aspirin 81 MG tablet Take 81 mg by mouth daily.    . betamethasone dipropionate (DIPROLENE) 0.05 % cream  APPLY TO AFFECTED AREA TWICE A DAY  1  . brimonidine (ALPHAGAN) 0.2 % ophthalmic solution     . carbidopa-levodopa (SINEMET IR) 25-100 MG tablet Take by mouth.    . donepezil (ARICEPT) 10 MG tablet TAKE 1 TABLET EVERY MORNING    . dorzolamide (TRUSOPT) 2 % ophthalmic solution Place 1 drop into both eyes 3 (three) times daily.    . finasteride (PROSCAR) 5 MG tablet Take 1 tablet (5 mg total) by mouth daily. 90 tablet 1  . latanoprost (XALATAN) 0.005 % ophthalmic solution Place 1 drop into both eyes at bedtime.    . Misc. Devices (ROLLATOR) MISC 1 Device by Does not apply route daily. 1 each 0  . montelukast (SINGULAIR) 10 MG tablet TAKE 1 TABLET AT BEDTIME    . Multiple Vitamin (MULTIVITAMIN WITH MINERALS) TABS Take 1 tablet by mouth daily.    . niacin (NIASPAN) 500 MG CR tablet Take 500 mg by mouth at bedtime.    Marland Kitchen omeprazole (PRILOSEC) 40 MG capsule Take 1 capsule (40 mg total) by mouth daily. 90 capsule 1  . timolol (BETIMOL) 0.25 % ophthalmic solution Place 1-2 drops into both eyes 2 (two) times daily.    . timolol (TIMOPTIC) 0.5 % ophthalmic solution     .  triamcinolone cream (KENALOG) 0.5 % Apply 1 application topically 2 (two) times daily. 45 g 0   No current facility-administered medications for this visit.     REVIEW OF SYSTEMS:  [X]  denotes positive finding, [ ]  denotes negative finding Cardiac  Comments:  Chest pain or chest pressure:    Shortness of breath upon exertion:    Short of breath when lying flat:    Irregular heart rhythm:        Vascular    Pain in calf, thigh, or hip brought on by ambulation:    Pain in feet at night that wakes you up from your sleep:  X   Blood clot in your veins:    Leg swelling:         Pulmonary    Oxygen at home:    Productive cough:     Wheezing:         Neurologic    Sudden weakness in arms or legs:     Sudden numbness in arms or legs:     Sudden onset of difficulty speaking or slurred speech:    Temporary loss of vision in one  eye:     Problems with dizziness:         Gastrointestinal    Blood in stool:     Vomited blood:         Genitourinary    Burning when urinating:     Blood in urine:        Psychiatric    Major depression:         Hematologic    Bleeding problems:    Problems with blood clotting too easily:        Skin    Rashes or ulcers: X       Constitutional    Fever or chills:     PHYSICAL EXAM:   Vitals:   08/18/17 1244  BP: 124/75  Pulse: (!) 55  Resp: 14  Temp: (!) 97.3 F (36.3 C)  SpO2: 100%  Weight: 192 lb (87.1 kg)  Height: 6\' 1"  (1.854 m)    GENERAL: The patient is a well-nourished male, in no acute distress. The vital signs are documented above. CARDIAC: There is a regular rate and rhythm.  VASCULAR: I do not detect carotid bruits. On the right side he has a palpable femoral, popliteal, and posterior tibial pulse.  I cannot palpate a dorsalis pedis pulse. On the left side he has a palpable femoral, popliteal, posterior tibial pulse.  His dorsalis pedis pulses palpable but diminished. He has no significant lower extremity swelling. There is no significant hyperpigmentation to suggest chronic venous insufficiency. PULMONARY: There is good air exchange bilaterally without wheezing or rales. ABDOMEN: Soft and non-tender with normal pitched bowel sounds.  MUSCULOSKELETAL: There are no major deformities or cyanosis. NEUROLOGIC: No focal weakness or paresthesias are detected. SKIN: He has a rash on both feet involving the plantar aspect and dorsum of the feet.  This does not appear to be to be related to arterial or venous disease. PSYCHIATRIC: The patient has a normal affect.  DATA:    ARTERIAL DOPPLER STUDY: I have independently interpreted his arterial Doppler study.  On the right side there is a triphasic posterior tibial signal biphasic dorsalis pedis signal.  ABI is 100%.  On the left side there is a triphasic posterior tibial signal and a biphasic dorsalis pedis  signal.  ABI is 100%.  MEDICAL ISSUES:   DISCOLORATION OF BOTH FEET: Based  on his exam and Doppler study, he has no evidence of significant arterial insufficiency.  He has palpable posterior tibial pulses bilaterally and normal ABIs and normal Doppler signals.  Likewise, this does not appear to be related to venous disease.  He does not have any significant hyperpigmentation in his legs nor any significant varicosities.  This appears to be a rash and if this does not respond to the Kenalog cream that was prescribed then the patient could potentially require dermatologic consultation.  Waverly Ferrari [facility] [phone]

## 2017-08-29 ENCOUNTER — Telehealth: Payer: Self-pay | Admitting: Physician Assistant

## 2017-08-29 DIAGNOSIS — R21 Rash and other nonspecific skin eruption: Secondary | ICD-10-CM

## 2017-08-29 NOTE — Telephone Encounter (Signed)
That is great news about the blood flow to feet.  I will get him a referal with dermatology.

## 2017-08-29 NOTE — Telephone Encounter (Signed)
PATIENT'S WIFE (JOYCE) WOULD LIKE SARAH TO KNOW THAT HER HUSBAND'S ULTRASOUND CAME BACK NORMAL FOR A CIRCULATION PROBLEM IN HIS FEET. DR. Cristal DeerHRISTOPHER DIXON SAID SINCE IT WAS NORMAL THAT HE SHOULD PROBABLY GO TO SEE A DERMATOLOGIST AND THAT HE WOULD DOCUMENT THIS SUGGESTION IN HIS NOTES TO SARAH. SHE SAID SHE HAS NOT HEARD ANYTHING BACK AND WANTED TO BE SURE SARAH WAS GOING TO REFER HIM. BEST PHONE [phone] [facility](WIFE'S CELL) MBC

## 2017-08-29 NOTE — Telephone Encounter (Signed)
Please see note below. 

## 2017-09-17 ENCOUNTER — Other Ambulatory Visit: Payer: Self-pay | Admitting: Physician Assistant

## 2017-09-17 DIAGNOSIS — L853 Xerosis cutis: Secondary | ICD-10-CM

## 2017-09-20 NOTE — Telephone Encounter (Signed)
Meds ordered this encounter  Medications  . triamcinolone cream (KENALOG) 0.5 %    Sig: APPLY TO AFFECTED AREA TWICE A DAY    Dispense:  45 g    Refill:  0

## 2018-01-13 ENCOUNTER — Ambulatory Visit: Payer: Medicare HMO | Admitting: Physician Assistant

## 2018-01-17 ENCOUNTER — Ambulatory Visit: Payer: Medicare HMO | Admitting: Physician Assistant

## 2018-01-17 ENCOUNTER — Other Ambulatory Visit: Payer: Self-pay

## 2018-01-17 ENCOUNTER — Encounter: Payer: Self-pay | Admitting: Physician Assistant

## 2018-01-17 VITALS — BP 122/70 | HR 75 | Temp 97.7°F | Resp 18 | Ht 73.0 in | Wt 199.0 lb

## 2018-01-17 DIAGNOSIS — B353 Tinea pedis: Secondary | ICD-10-CM

## 2018-01-17 DIAGNOSIS — R110 Nausea: Secondary | ICD-10-CM

## 2018-01-17 DIAGNOSIS — G20 Parkinson's disease: Secondary | ICD-10-CM

## 2018-01-17 DIAGNOSIS — G20A1 Parkinson's disease without dyskinesia, without mention of fluctuations: Secondary | ICD-10-CM

## 2018-01-17 DIAGNOSIS — R152 Fecal urgency: Secondary | ICD-10-CM | POA: Diagnosis not present

## 2018-01-17 DIAGNOSIS — R209 Unspecified disturbances of skin sensation: Secondary | ICD-10-CM

## 2018-01-17 DIAGNOSIS — R159 Full incontinence of feces: Secondary | ICD-10-CM

## 2018-01-17 DIAGNOSIS — H3530 Unspecified macular degeneration: Secondary | ICD-10-CM | POA: Diagnosis not present

## 2018-01-17 DIAGNOSIS — R32 Unspecified urinary incontinence: Secondary | ICD-10-CM

## 2018-01-17 NOTE — Patient Instructions (Addendum)
     IF you received an x-ray today, you will receive an invoice from [facility]. Please contact  Radiology at [phone] with questions or concerns regarding your invoice.   IF you received labwork today, you will receive an invoice from [facility]. Please contact [facility] at [phone] with questions or concerns regarding your invoice.   Our billing staff will not be able to assist you with questions regarding bills from these companies.  You will be contacted with the lab results as soon as they are available. The fastest way to get your results is to activate your My Chart account. Instructions are located on the last page of this paperwork. If you have not heard from us regarding the results in 2 weeks, please contact this office.     

## 2018-01-17 NOTE — Progress Notes (Signed)
Phillip Acosta  MRN: [id number] DOB: 07/13/1941  PCP: Morrell RiddleWeber, Sarah L, PA-C  Chief Complaint  Patient presents with  . Follow-up    follow up on feet and needs a shower chair     Subjective:  Pt presents to clinic for recheck.  He went to a dermatology and they treated his feet for fungal infection and they have been using the cream for the last several months and his feet look much better.  His wife wants to know if she still needs to use the cream.  They have had PT and OT come to the house and they have suggested a potty chair and shower chair.  Wife currently stands outside of shower because he is still able to bath himself but he is unsteady and she is worried he will fall.  She knows that she cannot get him up if he does fall.  He is getting weaker and getting off the toilet is getting harder.  He is having to wear depends because he has urinary urgency and recently he has been developing fecal incontinence - he has had a few accidents in public and he is starting to not want to leave the house.  She is having to leave him at home while he is sleeping to go to the grocery store because he cannot do it any more.  He sees the neurologist regularly.  He does feel like the aricept is upsetting his stomach.    Wife is able to still care for him currently but she knows that he is needing more and more care because of his parkinsons.  Memory is about the same per wife.  History is obtained by patient.  Review of Systems  Genitourinary: Positive for difficulty urinating (incontient).  Neurological: Positive for weakness (from parkinsons).    Patient Active Problem List   Diagnosis Date Noted  . Glaucoma 05/24/2017  . 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 Parkinson disease ([facility]) 05/24/2017  . Dementia 05/24/2017  . 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 Gastroesophageal reflux disease 12/04/2014  . Hyperlipidemia 12/04/2014  . Family  history of dementia 07/07/2012  . ASBESTOS EXPOSURE, HX OF 12/27/2008  . LUNG NODULE 08/29/2008  . ALLERGIC RHINITIS 08/02/2008  . DYSPNEA 07/25/2008    Current Outpatient Medications on File Prior to Visit  Medication Sig Dispense Refill  . acetaZOLAMIDE (DIAMOX) 250 MG tablet TAKE 1 TABLET BY MOUTH TWICE A DAY    . aspirin 81 MG tablet Take 81 mg by mouth daily.    . betamethasone dipropionate (DIPROLENE) 0.05 % cream APPLY TO AFFECTED AREA TWICE A DAY  1  . brimonidine (ALPHAGAN) 0.2 % ophthalmic solution     . carbidopa-levodopa (SINEMET IR) 25-100 MG tablet Take by mouth.    . donepezil (ARICEPT) 10 MG tablet TAKE 1 TABLET EVERY MORNING    . dorzolamide (TRUSOPT) 2 % ophthalmic solution Place 1 drop into both eyes 3 (three) times daily.    . finasteride (PROSCAR) 5 MG tablet Take 1 tablet (5 mg total) by mouth daily. 90 tablet 1  . latanoprost (XALATAN) 0.005 % ophthalmic solution Place 1 drop into both eyes at bedtime.    . Misc. Devices (ROLLATOR) MISC 1 Device by Does not apply route daily. 1 each 0  . montelukast (SINGULAIR) 10 MG tablet TAKE 1 TABLET AT BEDTIME    . Multiple Vitamin (MULTIVITAMIN WITH MINERALS) TABS Take 1 tablet by mouth daily.    .Marland Kitchen  niacin (NIASPAN) 500 MG CR tablet Take 500 mg by mouth at bedtime.    Marland Kitchen omeprazole (PRILOSEC) 40 MG capsule Take 1 capsule (40 mg total) by mouth daily. 90 capsule 1  . timolol (BETIMOL) 0.25 % ophthalmic solution Place 1-2 drops into both eyes 2 (two) times daily.    . timolol (TIMOPTIC) 0.5 % ophthalmic solution     . triamcinolone cream (KENALOG) 0.5 % APPLY TO AFFECTED AREA TWICE A DAY 45 g 0   No current facility-administered medications on file prior to visit.     No Known Allergies  Past Medical History:  Diagnosis Date  . BPH (benign prostatic hyperplasia)   . Cataract   . GERD (gastroesophageal reflux disease)   . Glaucoma   . Hyperlipemia   . Parkinson disease [facility])    Social History   Social History  Narrative   Lives with wife   Social History   Tobacco Use  . Smoking status: Never Smoker  . Smokeless tobacco: Never Used  Substance Use Topics  . Alcohol use: No  . Drug use: No   family history includes Mental retardation in his mother.     Objective:  BP 122/70   Pulse 75   Temp 97.7 F (36.5 C) (Oral)   Resp 18   Ht 6\' 1"  (1.854 m)   Wt 199 lb (90.3 kg)   SpO2 99%   BMI 26.25 kg/m  Body mass index is 26.25 kg/m.  Physical Exam  Constitutional: He is oriented to person, place, and time and well-developed, well-nourished, and in no distress.  HENT:  Head: Normocephalic and atraumatic.  Right Ear: External ear normal.  Left Ear: External ear normal.  Eyes: Conjunctivae are normal.  Neck: Normal range of motion.  Cardiovascular: Normal rate, regular rhythm and normal heart sounds.  No murmur heard. Pulmonary/Chest: Effort normal and breath sounds normal. He has no wheezes.  Neurological: He is alert and oriented to person, place, and time. He displays weakness. He displays no tremor. Gait abnormal.  Walks with cane here in office (at home rollating walker) - hunched over and slow and unsteady gait  Skin: Skin is warm and dry.  Cool purple toes - with slow capillary refill - not change from past - dry skin on heals and around ankles but no erythematous scaling rash like in the fall on either feet   Psychiatric: Mood, memory, affect and judgment normal.  Dementia - slow speech but able to communicate    Assessment and Plan :  Bilateral cold feet - he has been seen by vascular  Parkinson disease ([facility]) - Plan: DME Other see comment, DME Other see comment, DME Other see comment - Rx for toilet elevator, shower chair and incontinence supplies - she plans to call the insurance to see what is available to them - d/w wife things that could be helpful for her to help in his care even if it is someone to come into the home for a few hours a week so she feels comfortable  leaving.  They may need social work involved in the future.  Macular degeneration of both eyes, unspecified type - d/w pt's wife hot to help him with that. She is having to feed him due to him not being able to see  Incontinence of feces with fecal urgency - Plan: DME Other see comment - likely related to Parkinson - talk with neurologist and f/u with me if they do not think it is related  Urinary incontinence, unspecified type - Plan: DME Other see comment - see above about talking with neurologist - he has BPH and is on medication for that.  Nausea - He thinks that the aricept is making him nauseated - ok to trail for 2 weeks without it - if he notices no nausea - talk with neurologist if that is the case to see whether something else would be appropriate for him at this stage.  Tinea pedis - controlled - ok to stop medication   Benny Lennert PA-C  Primary Care at [facility] 01/17/2018 4:56 PM

## 2018-01-24 ENCOUNTER — Encounter: Payer: Self-pay | Admitting: Physician Assistant

## 2018-01-24 DIAGNOSIS — K219 Gastro-esophageal reflux disease without esophagitis: Secondary | ICD-10-CM

## 2018-01-25 MED ORDER — OMEPRAZOLE 40 MG PO CPDR: 40.0000 mg | DELAYED_RELEASE_CAPSULE | Freq: Every day | ORAL | 3 refills | Status: AC

## 2018-04-18 ENCOUNTER — Encounter: Payer: Self-pay | Admitting: Physician Assistant

## 2018-04-18 ENCOUNTER — Other Ambulatory Visit: Payer: Self-pay

## 2018-04-18 ENCOUNTER — Ambulatory Visit (INDEPENDENT_AMBULATORY_CARE_PROVIDER_SITE_OTHER): Payer: 59 | Admitting: Physician Assistant

## 2018-04-18 VITALS — BP 100/62 | HR 67 | Temp 97.9°F | Resp 18 | Ht 73.0 in | Wt 189.0 lb

## 2018-04-18 DIAGNOSIS — R209 Unspecified disturbances of skin sensation: Secondary | ICD-10-CM | POA: Diagnosis not present

## 2018-04-18 DIAGNOSIS — H3530 Unspecified macular degeneration: Secondary | ICD-10-CM | POA: Diagnosis not present

## 2018-04-18 DIAGNOSIS — R32 Unspecified urinary incontinence: Secondary | ICD-10-CM | POA: Diagnosis not present

## 2018-04-18 DIAGNOSIS — G20A1 Parkinson's disease without dyskinesia, without mention of fluctuations: Secondary | ICD-10-CM

## 2018-04-18 DIAGNOSIS — R159 Full incontinence of feces: Secondary | ICD-10-CM

## 2018-04-18 DIAGNOSIS — R152 Fecal urgency: Secondary | ICD-10-CM | POA: Diagnosis not present

## 2018-04-18 DIAGNOSIS — G20 Parkinson's disease: Secondary | ICD-10-CM

## 2018-04-18 DIAGNOSIS — K219 Gastro-esophageal reflux disease without esophagitis: Secondary | ICD-10-CM | POA: Diagnosis not present

## 2018-04-18 NOTE — Progress Notes (Signed)
Phillip Acosta  MRN: [id number] DOB: 02/04/1941  PCP: Morrell RiddleWeber, Sarah L, PA-C  Chief Complaint  Patient presents with  . Follow-up    3 month follow up     Subjective:  Pt presents to clinic for 628-month follow-up.  He just saw his neurologist yesterday and no medications were changed.  He is stable with his Parkinson's.  He is still not interested in leaving the house.  Currently he and his wife are doing okay.  Wife is interested in maybe having some home health.  She is going on vacation and is hoping to maybe have somebody help her with a day or 2 of someone being with him in the home while she is gone.  History is obtained by patient and wife.  Review of Systems  Patient Active Problem List   Diagnosis Date Noted  . Glaucoma 05/24/2017  . 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 Parkinson disease (HCC) 05/24/2017  . Dementia 05/24/2017  . 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 Gastroesophageal reflux disease 12/04/2014  . Hyperlipidemia 12/04/2014  . Family history of dementia 07/07/2012  . ASBESTOS EXPOSURE, HX OF 12/27/2008  . LUNG NODULE 08/29/2008  . ALLERGIC RHINITIS 08/02/2008  . DYSPNEA 07/25/2008    Current Outpatient Medications on File Prior to Visit  Medication Sig Dispense Refill  . acetaZOLAMIDE (DIAMOX) 250 MG tablet TAKE 1 TABLET BY MOUTH TWICE A DAY    . aspirin 81 MG tablet Take 81 mg by mouth daily.    . betamethasone dipropionate (DIPROLENE) 0.05 % cream APPLY TO AFFECTED AREA TWICE A DAY  1  . brimonidine (ALPHAGAN) 0.2 % ophthalmic solution     . carbidopa-levodopa (SINEMET IR) 25-100 MG tablet Take by mouth.    . donepezil (ARICEPT) 10 MG tablet TAKE 1 TABLET EVERY MORNING    . dorzolamide (TRUSOPT) 2 % ophthalmic solution Place 1 drop into both eyes 3 (three) times daily.    . finasteride (PROSCAR) 5 MG tablet Take 1 tablet (5 mg total) by mouth daily. 90 tablet 1  . latanoprost (XALATAN) 0.005 %  ophthalmic solution Place 1 drop into both eyes at bedtime.    . Misc. Devices (ROLLATOR) MISC 1 Device by Does not apply route daily. 1 each 0  . montelukast (SINGULAIR) 10 MG tablet TAKE 1 TABLET AT BEDTIME    . Multiple Vitamin (MULTIVITAMIN WITH MINERALS) TABS Take 1 tablet by mouth daily.    . niacin (NIASPAN) 500 MG CR tablet Take 500 mg by mouth at bedtime.    Marland Kitchen. omeprazole (PRILOSEC) 40 MG capsule Take 1 capsule (40 mg total) by mouth daily. 90 capsule 3  . timolol (BETIMOL) 0.25 % ophthalmic solution Place 1-2 drops into both eyes 2 (two) times daily.    . timolol (TIMOPTIC) 0.5 % ophthalmic solution     . triamcinolone cream (KENALOG) 0.5 % APPLY TO AFFECTED AREA TWICE A DAY 45 g 0   No current facility-administered medications on file prior to visit.     No Known Allergies  Past Medical History:  Diagnosis Date  . BPH (benign prostatic hyperplasia)   . Cataract   . GERD (gastroesophageal reflux disease)   . Glaucoma   . Hyperlipemia   . Parkinson disease [facility](HCC)    Social History   Social History Narrative   Lives with wife   Social History   Tobacco Use  . Smoking status: Never Smoker  . Smokeless tobacco: Never Used  Substance  Use Topics  . Alcohol use: No  . Drug use: No   family history includes Mental retardation in his mother.     Objective:  BP 100/62   Pulse 67   Temp 97.9 F (36.6 C) (Oral)   Resp 18   Ht 6\' 1"  (1.854 m)   Wt 189 lb (85.7 kg)   SpO2 98%   BMI 24.94 kg/m  Body mass index is 24.94 kg/m.  Wt Readings from Last 3 Encounters:  04/18/18 189 lb (85.7 kg)  01/17/18 199 lb (90.3 kg)  08/18/17 192 lb (87.1 kg)    Physical Exam  Constitutional: He is oriented to person, place, and time.  Slow speech, no change from past  HENT:  Head: Normocephalic and atraumatic.  Right Ear: External ear normal.  Left Ear: External ear normal.  Eyes: Conjunctivae are normal.  Neck: Normal range of motion.  Cardiovascular: Normal rate,  regular rhythm and normal heart sounds.  No murmur heard. Pulmonary/Chest: Effort normal and breath sounds normal. He has no wheezes.  Musculoskeletal:  Moves slow, walks with cane, slightly unsteady  Neurological: He is alert and oriented to person, place, and time.  Skin: Skin is warm and dry.  Right foot is purple with capillary refill is approximately 3 seconds, good pulses Left foot red no warmth capillary refill approximately 3 seconds, good pulses Bilateral legs with dry skin Bilateral multiple toenails with onychomycosis  Psychiatric: Judgment normal.    Assessment and Plan :  Parkinson disease (HCC)  Bilateral cold feet  Macular degeneration of both eyes, unspecified type  Incontinence of feces with fecal urgency  Urinary incontinence, unspecified type  Gastroesophageal reflux disease, esophagitis presence not specified   Follow-up on all medical conditions.  Patient will continue following up with neurologist for Parkinson's disease.  Patient will continue taking medication for reflux which he has a year worth of refills written by me.  He continues to have incontinence and wears adult diapers.  Discussed again with wife and patient when the need arises for home care that it is available to them.  I will start to look into options for them and communicate with wife through my chart as she is starting to think that this may be helpful to her.  Patient verbalized to me that they understand the following: diagnosis, what is being done for them, what to expect and what should be done at home.  Their questions have been answered.  See after visit summary for patient specific instructions.  Benny Lennert PA-C  Primary Care at [facility] 04/18/2018 2:39 PM  Please note: Portions of this report may have been transcribed using dragon voice recognition software. Every effort was made to ensure accuracy; however, inadvertent computerized transcription errors  may be present.

## 2018-04-18 NOTE — Patient Instructions (Addendum)
  Triamcinolone 0.5% cream can be mixed with lotion to help with dry skin - this cannot go on the face   IF you received an x-ray today, you will receive an invoice from [facility]. Please contact [facility] at [phone] with questions or concerns regarding your invoice.   IF you received labwork today, you will receive an invoice from [facility]. Please contact [facility] at [phone] with questions or concerns regarding your invoice.   Our billing staff will not be able to assist you with questions regarding bills from these companies.  You will be contacted with the lab results as soon as they are available. The fastest way to get your results is to activate your My Chart account. Instructions are located on the last page of this paperwork. If you have not heard from us regarding the results in 2 weeks, please contact this office.

## 2018-06-30 ENCOUNTER — Other Ambulatory Visit: Payer: Self-pay

## 2018-06-30 ENCOUNTER — Encounter: Payer: Self-pay | Admitting: Physician Assistant

## 2018-06-30 ENCOUNTER — Ambulatory Visit (INDEPENDENT_AMBULATORY_CARE_PROVIDER_SITE_OTHER): Payer: Medicare HMO | Admitting: Physician Assistant

## 2018-06-30 VITALS — BP 106/72 | HR 96 | Temp 98.4°F | Resp 18 | Ht 73.0 in | Wt 190.0 lb

## 2018-06-30 DIAGNOSIS — R152 Fecal urgency: Secondary | ICD-10-CM

## 2018-06-30 DIAGNOSIS — Z23 Encounter for immunization: Secondary | ICD-10-CM | POA: Diagnosis not present

## 2018-06-30 DIAGNOSIS — H3530 Unspecified macular degeneration: Secondary | ICD-10-CM

## 2018-06-30 DIAGNOSIS — F0280 Dementia in other diseases classified elsewhere without behavioral disturbance: Secondary | ICD-10-CM

## 2018-06-30 DIAGNOSIS — G20 Parkinson's disease: Secondary | ICD-10-CM | POA: Diagnosis not present

## 2018-06-30 DIAGNOSIS — R2681 Unsteadiness on feet: Secondary | ICD-10-CM

## 2018-06-30 DIAGNOSIS — R197 Diarrhea, unspecified: Secondary | ICD-10-CM

## 2018-06-30 DIAGNOSIS — R159 Full incontinence of feces: Secondary | ICD-10-CM | POA: Diagnosis not present

## 2018-06-30 DIAGNOSIS — R32 Unspecified urinary incontinence: Secondary | ICD-10-CM

## 2018-06-30 DIAGNOSIS — G20A1 Parkinson's disease without dyskinesia, without mention of fluctuations: Secondary | ICD-10-CM

## 2018-06-30 MED ORDER — 3-IN-1 BEDSIDE TOILET MISC: 1.0000 | Freq: Once | 0 refills | Status: DC

## 2018-06-30 MED ORDER — 3-IN-1 BEDSIDE TOILET MISC: 1.0000 | Freq: Once | 0 refills | Status: AC

## 2018-06-30 MED ORDER — CHOLESTYRAMINE 4 G PO PACK: 4.0000 g | PACK | Freq: Two times a day (BID) | ORAL | 1 refills | Status: AC

## 2018-06-30 NOTE — Patient Instructions (Signed)
° ° ° °  If you have lab work done today you will be contacted with your lab results within the next 2 weeks.  If you have not heard from us then please contact us. The fastest way to get your results is to register for My Chart. ° ° °IF you received an x-ray today, you will receive an invoice from [facility]. Please contact  Radiology at [phone] with questions or concerns regarding your invoice.  ° °IF you received labwork today, you will receive an invoice from [facility]. Please contact [facility] at [phone] with questions or concerns regarding your invoice.  ° °Our billing staff will not be able to assist you with questions regarding bills from these companies. ° °You will be contacted with the lab results as soon as they are available. The fastest way to get your results is to activate your My Chart account. Instructions are located on the last page of this paperwork. If you have not heard from us regarding the results in 2 weeks, please contact this office. °  ° ° ° °

## 2018-06-30 NOTE — Progress Notes (Signed)
Phillip Acosta  MRN: [id number] DOB: 1941-06-19  PCP: Morrell Riddle, PA-C  Chief Complaint  Patient presents with  . Diarrhea    here to discuss     Subjective:  Pt presents to clinic for long standing problems with diarrhea.  He has had loose stools for a long time - he has had a colonoscopy which was normal and his GI has looked into the diarrhea.  Lately the stools are becoming more loose but the really problem is that he is now unable to control his bowels. They are either water or mush. He is wearing depends type protection.  He has a slow gait and moves slowly so he is really not getting to the bathroom in time.  He is going at least 2x/day.  His wife is having to clean up after each stool both the bathroom or floor leading to it as well as him.  She has been helping him with showers but it is starting to become to much for her.  She requests some help.  He is now unable to go places due to gait and dementia.  She is feeling stuck at home.  He can no longer be alone.  History is obtained by patient and wife.  Review of Systems  Constitutional: Negative for chills and fever.  Gastrointestinal: Positive for diarrhea.  Neurological: Positive for tremors and weakness.    Patient Active Problem List   Diagnosis Date Noted  . Glaucoma 05/24/2017  . 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 Parkinson disease (HCC) 05/24/2017  . Dementia 05/24/2017  . 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 Gastroesophageal reflux disease 12/04/2014  . Hyperlipidemia 12/04/2014  . Family history of dementia 07/07/2012  . ASBESTOS EXPOSURE, HX OF 12/27/2008  . LUNG NODULE 08/29/2008  . ALLERGIC RHINITIS 08/02/2008  . DYSPNEA 07/25/2008    Current Outpatient Medications on File Prior to Visit  Medication Sig Dispense Refill  . acetaZOLAMIDE (DIAMOX) 250 MG tablet TAKE 1 TABLET BY MOUTH TWICE A DAY    . aspirin 81 MG tablet Take 81 mg by mouth daily.      . betamethasone dipropionate (DIPROLENE) 0.05 % cream APPLY TO AFFECTED AREA TWICE A DAY  1  . brimonidine (ALPHAGAN) 0.2 % ophthalmic solution     . carbidopa-levodopa (SINEMET IR) 25-100 MG tablet Take by mouth.    . donepezil (ARICEPT) 10 MG tablet TAKE 1 TABLET EVERY MORNING    . dorzolamide (TRUSOPT) 2 % ophthalmic solution Place 1 drop into both eyes 3 (three) times daily.    . finasteride (PROSCAR) 5 MG tablet Take 1 tablet (5 mg total) by mouth daily. 90 tablet 1  . latanoprost (XALATAN) 0.005 % ophthalmic solution Place 1 drop into both eyes at bedtime.    . Misc. Devices (ROLLATOR) MISC 1 Device by Does not apply route daily. 1 each 0  . montelukast (SINGULAIR) 10 MG tablet TAKE 1 TABLET AT BEDTIME    . Multiple Vitamin (MULTIVITAMIN WITH MINERALS) TABS Take 1 tablet by mouth daily.    . niacin (NIASPAN) 500 MG CR tablet Take 500 mg by mouth at bedtime.    Marland Kitchen omeprazole (PRILOSEC) 40 MG capsule Take 1 capsule (40 mg total) by mouth daily. 90 capsule 3  . timolol (BETIMOL) 0.25 % ophthalmic solution Place 1-2 drops into both eyes 2 (two) times daily.    . timolol (TIMOPTIC) 0.5 % ophthalmic solution     . triamcinolone cream (KENALOG)  0.5 % APPLY TO AFFECTED AREA TWICE A DAY 45 g 0   No current facility-administered medications on file prior to visit.     No Known Allergies  Past Medical History:  Diagnosis Date  . BPH (benign prostatic hyperplasia)   . Cataract   . GERD (gastroesophageal reflux disease)   . Glaucoma   . Hyperlipemia   . Parkinson disease [facility])    Social History   Social History Narrative   Lives with wife   Social History   Tobacco Use  . Smoking status: Never Smoker  . Smokeless tobacco: Never Used  Substance Use Topics  . Alcohol use: No  . Drug use: No   family history includes Mental retardation in his mother.     Objective:  BP 106/72   Pulse 96   Temp 98.4 F (36.9 C) (Oral)   Resp 18   Ht 6\' 1"  (1.854 m)   Wt 190 lb (86.2 kg)    SpO2 94%   BMI 25.07 kg/m  Body mass index is 25.07 kg/m.  Wt Readings from Last 3 Encounters:  06/30/18 190 lb (86.2 kg)  04/18/18 189 lb (85.7 kg)  01/17/18 199 lb (90.3 kg)    Physical Exam  Constitutional: He is oriented to person, place, and time. He appears well-developed and well-nourished.  HENT:  Head: Normocephalic and atraumatic.  Right Ear: External ear normal.  Left Ear: External ear normal.  Eyes: Conjunctivae are normal.  Neck: Normal range of motion.  Cardiovascular: Normal rate, regular rhythm and normal heart sounds.  No murmur heard. Pulmonary/Chest: Effort normal and breath sounds normal. He has no wheezes.  Neurological: He is alert and oriented to person, place, and time. Gait (shuffles - minimal movement) abnormal.  Skin: Skin is warm and dry.  Psychiatric: Judgment normal. Cognition and memory are impaired.  Slow speech,  Vitals reviewed.   Assessment and Plan :  Parkinson disease (HCC) - Plan: Misc. Devices (3-IN-1 BEDSIDE TOILET) MISC, Ambulatory referral to Home Health, DISCONTINUED: Misc. Devices (3-IN-1 BEDSIDE TOILET) MISC - d/w pt and wife to have a bedside comode to decrease the amount of space he might travel - consider getting carpet covers that are not trip hazards - they are in the process of moving things in home so he is safer.  Home health is needed to assist in ADLs and allow the wife to be able to leave the house.  Flu vaccine need - Plan: Flu vaccine HIGH DOSE PF (Fluzone High dose)  Macular degeneration of both eyes, unspecified type - Plan: Ambulatory referral to Home Health -   Incontinence of feces with fecal urgency - Plan: Misc. Devices (3-IN-1 BEDSIDE TOILET) MISC, Ambulatory referral to Home Health, DISCONTINUED: Misc. Devices (3-IN-1 BEDSIDE TOILET) MISC  Urinary incontinence, unspecified type - Plan: Ambulatory referral to Home Health  Unsteady gait - Plan: Ambulatory referral to Home Health  Dementia due to Parkinson's  disease without behavioral disturbance (HCC) - Plan: Misc. Devices (3-IN-1 BEDSIDE TOILET) MISC, Ambulatory referral to Home Health, DISCONTINUED: Misc. Devices (3-IN-1 BEDSIDE TOILET) MISC  Diarrhea, unspecified type - Plan: cholestyramine (QUESTRAN) 4 g packet, Ambulatory referral to Home Health - trial of this medication to see if we can help with fecal incontinence  Patient verbalized to me that they understand the following: diagnosis, what is being done for them, what to expect and what should be done at home.  Their questions have been answered.  See after visit summary for patient specific instructions.  Benny Lennert PA-C  Primary Care at [facility] 06/30/2018 3:17 PM  Please note: Portions of this report may have been transcribed using dragon voice recognition software. Every effort was made to ensure accuracy; however, inadvertent computerized transcription errors may be present.

## 2018-07-04 ENCOUNTER — Telehealth: Payer: Self-pay | Admitting: Family Medicine

## 2018-07-04 NOTE — Telephone Encounter (Signed)
Copied from CRM [id number]. Topic: Quick Communication - See Telephone Encounter >> Jul 04, 2018 11:47 AM Angela Nevin wrote: CRM for notification. See Telephone encounter for: 07/04/18.   Cindee Lame, physical therapist with [facility] stated he completed an evaluation of pt and is requesting continuation orders.   LT#[phone]

## 2018-07-05 NOTE — Telephone Encounter (Signed)
Please see note below. 

## 2018-07-07 NOTE — Telephone Encounter (Signed)
Millie from [facility] called to request verbal orders for two follow up visits to establish some community resources for patient. Please advise. Ph # [phone]

## 2018-07-07 NOTE — Telephone Encounter (Signed)
Verbal given 

## 2018-07-12 ENCOUNTER — Telehealth: Payer: Self-pay | Admitting: Family Medicine

## 2018-07-12 NOTE — Telephone Encounter (Signed)
Verbal given left detailed message

## 2018-07-12 NOTE — Telephone Encounter (Signed)
Copied from CRM [phone]. Topic: Quick Communication - See Telephone Encounter >> Jul 12, 2018  9:33 AM Burchel, Abbi R wrote: CRM for notification. See Telephone encounter for: 07/12/18.  Tammy, [facility] is requesting v/o for speech therapy frequency: 1x1wk, 2x2wk, 1x1wk   [phone] OK to leave VM

## 2018-07-17 ENCOUNTER — Telehealth: Payer: Self-pay | Admitting: Family Medicine

## 2018-07-17 NOTE — Telephone Encounter (Signed)
Copied from CRM [phone]. Topic: Quick Communication - See Telephone Encounter >> Jul 17, 2018 11:10 AM Trula SladeWalter, Linda F wrote: CRM for notification. See Telephone encounter for: 07/17/18. Liberty HandyGary Aramini a PT w/[facility] [phone] wanted the provider to know that the patient fell yesterday around 2-4am in the morning, in the bathroom.  There is no injuries or reasons to go to the ER. The patient said he has pain in his lower back and rgt knee, 4 out of 10 pain.

## 2018-07-17 NOTE — Telephone Encounter (Unsigned)
Copied from CRM [id number]. Topic: General - Other >> Jul 17, 2018 11:17 AM Trula SladeWalter, Linda F wrote: Reason for CRM:  Clydie BraunKaren a nurse w/[facility] [phone] wanted the provider to know that she wasn't able to reach the patient to set up his last visit last week.

## 2018-07-18 NOTE — Telephone Encounter (Signed)
Provider informed.

## 2018-07-20 ENCOUNTER — Ambulatory Visit: Payer: Self-pay | Admitting: *Deleted

## 2018-07-20 NOTE — Telephone Encounter (Signed)
Pt's Speech Therapist 'Tammy' calling to question if pt can take Goodies Aspirin Powder with Diamox.  States he has been taking the aspirin powder for chronic knee pain. Pt is asymptomatic.   Please advise: [phone]

## 2018-07-21 MED ORDER — DICLOFENAC SODIUM 1 % TD GEL: 2.0000 g | Freq: Four times a day (QID) | TRANSDERMAL | 2 refills | Status: DC

## 2018-07-21 NOTE — Telephone Encounter (Signed)
Please call back and advise that use of high dose aspirin and diamox is NOT recommended. I have sent a rx into [facility] for diclofenac gel that he/someone can apply on his knees when they hurt. Thanks

## 2018-07-21 NOTE — Addendum Note (Signed)
Addended by: Myles Lipps on: 07/21/2018 10:54 PM   Modules accepted: Orders

## 2018-07-25 ENCOUNTER — Telehealth: Payer: Self-pay | Admitting: Family Medicine

## 2018-07-25 NOTE — Telephone Encounter (Signed)
Copied from CRM [id number]. Topic: General - Other >> Jul 25, 2018  4:44 PM Stephannie Li, NT wrote: Reason for CRM: Tammie from [facility] called and  she has concerns the patient is managing his own medication , he is visually impaired , she asked his wife to take over assisting him with his medication .,she has also spoken to the wife about possible placement due to falls , or hire a care giver she was told to come to practice to get a letter for placement she can be called at [phone]

## 2018-07-25 NOTE — Telephone Encounter (Signed)
Left message to call back See below note.

## 2018-07-26 ENCOUNTER — Other Ambulatory Visit: Payer: Self-pay

## 2018-07-26 MED ORDER — DICLOFENAC SODIUM 1 % TD GEL: 2.0000 g | Freq: Four times a day (QID) | TRANSDERMAL | 2 refills | Status: AC

## 2018-07-26 NOTE — Progress Notes (Signed)
Spoke with wife, she states she has given pt only 1 Goody's powder.  Advised Voltaren was sent to pharmacy. Advised how to apply.  Wife req prescription be sent to [facility] - resent to [facility].

## 2018-07-26 NOTE — Telephone Encounter (Signed)
See message - I spoke with wife.  Re Goody powders.  Resent voltaren to [facility] per her request.

## 2018-07-26 NOTE — Telephone Encounter (Signed)
Message from [facility] re: letter for placement sent to Dr. Neva Seat

## 2018-07-27 NOTE — Telephone Encounter (Signed)
Mr. Shorey was previously cared for by Benny Lennert, PAC.  SHe as now at another practice.  If possible I would like ot meet with them to discuss ongoing concerns and placement discussion.  alternatively they can follow up with Sarah at her new practice if they prefer. Let me know either way.

## 2018-08-03 ENCOUNTER — Telehealth: Payer: Self-pay | Admitting: Family Medicine

## 2018-08-03 NOTE — Telephone Encounter (Signed)
Verbal given 

## 2018-08-03 NOTE — Telephone Encounter (Signed)
Copied from CRM [id number]. Topic: Quick Communication - See Telephone Encounter >> Aug 03, 2018  8:47 AM Arlyss Gandy, NT wrote: CRM for notification. See Telephone encounter for: 08/03/18. Betsy, Physical therapist with [facility] calling to get verbal orders for this pt for 2 times a week for 3 weeks, 1 time a week for 1 week. CB#: [phone]

## 2018-08-15 ENCOUNTER — Telehealth: Payer: Self-pay | Admitting: General Practice

## 2018-08-15 NOTE — Telephone Encounter (Signed)
Ashlee wiith [facility] calling to see if the pt is supposed to be taking Diamox. Ashlee states if the pt is supposed to take this medication the pt is completely out and needs a refill. Diamox is currently listed as being filled previously by historical provider on 05/24/17. med on the pt's current medication list. LOV on 06/30/18 with Lilia Argue

## 2018-08-15 NOTE — Telephone Encounter (Signed)
Ashlee at [facility] would like to know if patient suppose to be taking Diamox. It was prescribed on 7/31//2018 by Benny Lennert PA-C his primary provider, the patient out of this medication and needs a refill. Please advise, thank you.

## 2018-08-15 NOTE — Telephone Encounter (Signed)
Copied from CRM [id number]. Topic: Quick Communication - See Telephone Encounter >> Aug 15, 2018  4:51 PM Herby Abraham C wrote: CRM for notification. See Telephone encounter for: 08/15/18.  Ashlee w/ [facility] called in to be advised. She would like to know if pt is suppose to still be taking Diamox? If so pt need a refill for medication, pt is completely out.    [facility] - [place], [place] - [address] [phone] (Phone) [phone] (Fax)

## 2018-08-17 NOTE — Telephone Encounter (Signed)
Please call Phillip Acosta/patient back and have them discuss with his eye doctor as this medication is used for glaucoma. Thanks

## 2018-08-18 NOTE — Telephone Encounter (Signed)
Spoke with pt wife and she informed me that Mr. Stukey has stopped taking the Diamox b/c the eye doctor advised him to stop the medication some months back. I verbalized understanding and she advised me that she would give Morrie Sheldon a call and informed her.

## 2018-10-17 ENCOUNTER — Telehealth: Payer: Self-pay | Admitting: Family Medicine

## 2018-10-17 NOTE — Telephone Encounter (Unsigned)
Copied from CRM [id number]. Topic: Quick Communication - Home Health Verbal Orders >> Oct 17, 2018  9:08 AM Debroah LoopLander, Lumin L wrote: Caller/Agency: Pattie, with [facility] [place] Callback Number: [phone] Requesting OT/PT/Skilled Nursing/Social Work: Re-certification for Hospice Care Frequency need verbal to re-certify for hospice

## 2018-10-19 NOTE — Telephone Encounter (Signed)
Called - left message

## 2018-10-19 NOTE — Telephone Encounter (Signed)
Gave verbal

## 2018-11-27 ENCOUNTER — Telehealth: Payer: Self-pay

## 2018-11-27 NOTE — Telephone Encounter (Signed)
Spoke with triage nurse Sherri at [facility] and [facility] [place] regarding pt care. She will speak with the routine Nurse to see if the pt is able to come for ov

## 2018-12-12 ENCOUNTER — Telehealth: Payer: Self-pay

## 2018-12-12 NOTE — Telephone Encounter (Signed)
Called, left message for Phillip Acosta to call bk. Needs to know if there is a on call doctor at the facility. If so, can the pt be seen there. If not, is there a way we can possibly set up e health visits

## 2018-12-12 NOTE — Telephone Encounter (Signed)
Spoke with nurse Sherri, pt may not be capable or coming in for ov due to increased fatigue, lots of coughing and drooling. Pt lays in the bed a lot and is very hard to get him in and out of a car.

## 2018-12-13 ENCOUNTER — Telehealth: Payer: Self-pay

## 2018-12-13 NOTE — Telephone Encounter (Signed)
Spoke with nurse regarding pt, told her [employer] Engineer, manufacturing has instructed that this pt be seen by Benny Lennert being that she is the one that seen him last and dx'd him. Nurse verbalized understanding and was given Sarah's forwarding information.

## 2019-01-18 ENCOUNTER — Ambulatory Visit: Payer: Medicare HMO | Admitting: Family Medicine

## 2019-04-25 DEATH — deceased

## 2020-01-04 NOTE — Telephone Encounter (Signed)
No action needed
